# Patient Record
Sex: Male | Born: 1935 | Race: White | Hispanic: No | Smoking: Former smoker
Health system: Southern US, Community
[De-identification: ages and names within clinical notes are randomized; demographics above are authoritative.]

## PROBLEM LIST (undated history)

## (undated) DIAGNOSIS — I499 Cardiac arrhythmia, unspecified: Secondary | ICD-10-CM

## (undated) DIAGNOSIS — I219 Acute myocardial infarction, unspecified: Secondary | ICD-10-CM

## (undated) DIAGNOSIS — N400 Enlarged prostate without lower urinary tract symptoms: Secondary | ICD-10-CM

## (undated) DIAGNOSIS — Z950 Presence of cardiac pacemaker: Secondary | ICD-10-CM

## (undated) DIAGNOSIS — F0390 Unspecified dementia without behavioral disturbance: Secondary | ICD-10-CM

## (undated) DIAGNOSIS — I10 Essential (primary) hypertension: Secondary | ICD-10-CM

## (undated) DIAGNOSIS — M1990 Unspecified osteoarthritis, unspecified site: Secondary | ICD-10-CM

## (undated) DIAGNOSIS — E079 Disorder of thyroid, unspecified: Secondary | ICD-10-CM

## (undated) DIAGNOSIS — R001 Bradycardia, unspecified: Secondary | ICD-10-CM

## (undated) DIAGNOSIS — I509 Heart failure, unspecified: Secondary | ICD-10-CM

## (undated) DIAGNOSIS — R0602 Shortness of breath: Secondary | ICD-10-CM

## (undated) DIAGNOSIS — I209 Angina pectoris, unspecified: Secondary | ICD-10-CM

## (undated) DIAGNOSIS — I2510 Atherosclerotic heart disease of native coronary artery without angina pectoris: Secondary | ICD-10-CM

## (undated) HISTORY — PX: APPENDECTOMY: SHX54

## (undated) HISTORY — DX: Heart failure, unspecified: I50.9

## (undated) HISTORY — PX: CORONARY ANGIOPLASTY: SHX604

## (undated) HISTORY — DX: Disorder of thyroid, unspecified: E07.9

## (undated) HISTORY — PX: INSERT / REPLACE / REMOVE PACEMAKER: SUR710

---

## 2001-05-28 DIAGNOSIS — I219 Acute myocardial infarction, unspecified: Secondary | ICD-10-CM

## 2001-05-28 HISTORY — DX: Acute myocardial infarction, unspecified: I21.9

## 2001-07-12 ENCOUNTER — Inpatient Hospital Stay (HOSPITAL_COMMUNITY): Admission: RE | Admit: 2001-07-12 | Discharge: 2001-07-15 | Payer: Self-pay | Admitting: Cardiology

## 2004-05-28 DIAGNOSIS — I2510 Atherosclerotic heart disease of native coronary artery without angina pectoris: Secondary | ICD-10-CM

## 2004-05-28 DIAGNOSIS — R001 Bradycardia, unspecified: Secondary | ICD-10-CM

## 2004-05-28 HISTORY — DX: Atherosclerotic heart disease of native coronary artery without angina pectoris: I25.10

## 2004-05-28 HISTORY — DX: Bradycardia, unspecified: R00.1

## 2005-04-07 ENCOUNTER — Inpatient Hospital Stay (HOSPITAL_COMMUNITY): Admission: EM | Admit: 2005-04-07 | Discharge: 2005-04-12 | Payer: Self-pay | Admitting: Emergency Medicine

## 2005-04-09 ENCOUNTER — Encounter (INDEPENDENT_AMBULATORY_CARE_PROVIDER_SITE_OTHER): Payer: Self-pay | Admitting: Cardiology

## 2005-06-18 ENCOUNTER — Ambulatory Visit (HOSPITAL_COMMUNITY): Admission: RE | Admit: 2005-06-18 | Discharge: 2005-06-18 | Payer: Self-pay | Admitting: *Deleted

## 2005-06-18 ENCOUNTER — Encounter (INDEPENDENT_AMBULATORY_CARE_PROVIDER_SITE_OTHER): Payer: Self-pay | Admitting: *Deleted

## 2005-10-14 ENCOUNTER — Emergency Department (HOSPITAL_COMMUNITY): Admission: EM | Admit: 2005-10-14 | Discharge: 2005-10-14 | Payer: Self-pay | Admitting: Emergency Medicine

## 2005-10-30 HISTORY — PX: OTHER SURGICAL HISTORY: SHX169

## 2007-02-10 ENCOUNTER — Encounter: Admission: RE | Admit: 2007-02-10 | Discharge: 2007-02-10 | Payer: Self-pay | Admitting: Orthopaedic Surgery

## 2007-03-03 ENCOUNTER — Encounter: Admission: RE | Admit: 2007-03-03 | Discharge: 2007-03-03 | Payer: Self-pay | Admitting: Orthopaedic Surgery

## 2007-03-25 ENCOUNTER — Encounter: Admission: RE | Admit: 2007-03-25 | Discharge: 2007-03-25 | Payer: Self-pay | Admitting: Orthopaedic Surgery

## 2007-03-29 HISTORY — PX: PACEMAKER INSERTION: SHX728

## 2008-10-08 ENCOUNTER — Encounter: Admission: RE | Admit: 2008-10-08 | Discharge: 2008-10-08 | Payer: Self-pay | Admitting: Internal Medicine

## 2010-02-13 ENCOUNTER — Encounter: Admission: RE | Admit: 2010-02-13 | Discharge: 2010-02-13 | Payer: Self-pay | Admitting: Internal Medicine

## 2010-06-18 ENCOUNTER — Encounter: Payer: Self-pay | Admitting: Orthopaedic Surgery

## 2010-10-13 NOTE — Discharge Summary (Signed)
[place]. [facility]  Patient:    Johnathan [name], GIN Visit Number: [id number] MRN: [id number]          Service Type: MED Location: [phone] Attending Physician:  Silvestre Mesi Dictated by:   Aram Candela. Aleen Campi, M.D. Admit Date:  07/11/2001 Discharge Date: 07/15/2001   CC:         Bernadene Person, M.D.   Discharge Summary  DISCHARGE DIAGNOSES: 1. Acute anterior myocardial infarction. 2. Single vessel coronary artery disease. 3. Successful emergency angioplasty of his left anterior descending. 4. Hypokalemia.  REASON FOR ADMISSION:  This 77 year old male was admitted by Dr. Juleen China from his office where he presented with severe anterior chest pain.  An electrocardiogram in Dr. Otilio Carpen office revealed acute ST segment elevation in his anteroseptal leads.  He was then sent by ambulance to the cardiac catheterization lab at [facility] for an emergency cardiac catheterization.  His cardiac exam on admission revealed an irregular rhythm with an increased PMI and normal first and second heart sounds.  He had an S3 gallop at the apex.  He had no other signs of congestive heart failure.  He was noticeably uncomfortable at rest and was complaining of anterior chest pain.  Blood pressure was 122/90, pulse 75 and irregular.  Electrocardiogram showed ST elevation in his anteroseptal leads which was changed from his prior ECG.  HOSPITAL COURSE:  The patient was transferred from Dr. Otilio Carpen office to the cardiac catheterization lab at [facility] per ambulance.  In the catheterization lab, he had an emergent cardiac catheterization where he was found to have single vessel coronary artery disease and a critical 99% stenosis of his mid LAD with antegrade flow.  He underwent a successful angioplasty with stenting of his mid LAD improving his flow in his LAD. His chest pain was relieved and he had an improvement in his rhythm with much less ectopy.   He was also noted to have left ventricular dysfunction with anterior apical hypokinesia.  Postcatheterization, he had mild chest pain which was constant and felt to be a soreness in his chest.  His enzymes were remarkably low for the amount of chest pain and electrocardiographic changes that were present and denoted a small myocardial infarction.  With the amount of hypokinesia in his anterior apical area, it was felt that he either had stunned myocardium or a prior infarct in this area.  He remained stable without signs of congestive heart failure and tolerated increased activity with walking in the hall.  With his small elevation in enzymes and toleration of increased activity, he was considered stable for discharge on his third hospital day, July 15, 2001.  He was then discharged to home in stable, improving condition and given instructions for followup.  DISCHARGE MEDICATIONS: 1. Aspirin 81 mg q.d. 2. Flomax 0.4 mg q.h.s. 3. Plavix 75 mg q.d. x1 month. 4. Metoprolol 12.5 mg b.i.d.  ACTIVITY:  He was instructed to gradually increase his activity level to full activity within three weeks.  DIET:  Low cholesterol, low fat diet.  FOLLOWUP:  Office visit with Dr. Aleen Campi in two weeks after discharge. Follow up with Dr. Juleen China per routine.  CONDITION ON DISCHARGE:  Improving and stable. Dictated by:   Aram Candela. Aleen Campi, M.D. Attending Physician:  Silvestre Mesi DD:  07/24/01 TD:  07/25/01 Job: [id number]

## 2010-10-13 NOTE — H&P (Signed)
[place]. [facility]  Patient:    FAY, SWIDER Visit Number: [id number] MRN: [id number]          Service Type: MED Location: [phone] Attending Physician:  Silvestre Mesi Dictated by:   Tarri Fuller, P.A. Admit Date:  07/11/2001 Discharge Date: 07/15/2001                           History and Physical  DATE OF BIRTH:  [birth year]  CHIEF COMPLAINT:  Chest pain.  HISTORY OF PRESENT ILLNESS:  An 77 year old male presents to the office today with complaints of intermittent chest pain occurring both with rest and exertion during the last week.  He has had no associated shortness of breath, nausea, vomiting, diaphoresis, dizziness.  The pain does not radiate.  The pain is located substernally between the breasts and he describes the pain as a pressure.  He does have some occasional belching, but denies heartburn, dysphagia, nausea, vomiting, fever, abdominal or back pain, chills, edema, orthopnea.  He is a former smoker many years ago.  He frequently has an alcoholic drink before dinner, usually beer or wine.  He plays golf regularly and does both walk and ride the course.  He has not had any recent change in his exercise tolerance or pain in the chest while playing golf.  His father did die at 30 of a heart attack but no other family history of heart disease is noted.  REVIEW OF SYSTEMS:  Patient does complain of a recent cough.  No hemoptysis. He denies melena, hematochezia, urinary urgency, frequency, or dysuria.  No rash.  He does have a recent history of shingles in the right chest wall that has healed well.  He has had some loose stools for the last few days.  PAST MEDICAL HISTORY:  Hypertension, BPH, former smoker.  PAST SURGICAL HISTORY:  Bilateral inguinal herniorrhaphy, history of ruptured appendix, shoulder surgery, sinus surgery.  FAMILY HISTORY:  Mother died at 73 of unknown causes and had a nervous breakdown according  to patient.  Father died at age 62 of a heart attack.  One sister died in her 14s of ALS.  One brother died at 21 months of age from flu. Second brother died at 50 of suicide.  Third brother died at 19 of unknown causes.  His wife had TB at the age of 50.  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  He is a former smoker and does have an alcoholic beverage most days of the week.  ALLERGIES:  SKELAXIN.  CURRENT MEDICATIONS: 1. Norvasc 10 mg q.d. 2. HCTZ 12.5 mg q.d. 3. Flomax 0.4 mg q.h.s. 4. Aspirin q.d. 5. Eldercaps q.d.  OBJECTIVE:  VITAL SIGNS:  Age:  77.  Weight 153.5 pounds, blood pressure 122/90.  GENERAL:  Very pleasant, well-developed, well-nourished Caucasian elderly male in no acute distress.  Nontoxic appearing and well oriented, good historian. He has not received any nitroglycerin or aspirin here in the office, but did take some aspirin and Tylenol this morning.  HEENT:  Patient has good dentition without dentures or partial noted.  Uvula midline.  PERRLA.  Extraocular movements grossly normal.  Hearing is grossly normal.  TMs:  Clear.  NECK:  Supple without lymphadenopathy, JVD, strider, or retraction.  Possible right carotid bruit is noted.  CHEST:  Clear to auscultation bilaterally without wheezing, rales, rhonchi, or crackles.  CARDIAC:  Irregular  rate and rhythm without murmur.  No PMI displacement.  ABDOMEN:  Soft, nontender, and nondistended with ventral midline hernia which is not incarcerated.  He has good bowel sounds without bruits or pulsatile mass.  No organomegaly.  RECTAL:  No stool in the vault.  Normal rectal tone with II+ soft, nontender prostate.  EXTREMITIES:  Warm and dry with evidence of chronic sun exposure.  No edema or rash.  Grip strength is 5/5 and he has grossly normal musculoskeletal strength with a normal gait.  NEUROLOGIC:  Affect is pleasant and appropriate.  LABORATORIES:  EKG shows a new  T-wave inversion in precordial leads V2-V5 which represent an acute change since December 2002.  Frequent PACs are noted. Chest x-ray from December is sent with the patient to the hospital.  ASSESSMENT: 1. Acute chest pain with abnormal EKG, rule out myocardial infarction. 2. Hypertension.  PLAN:  Admit directly to [facility] Coronary Catheter[facility] with transport via EMS.  Patient is in stable condition.  Dr. Aleen Campi will perform catheterization immediately today.  Patient is discharged from office in stable condition with orders as completed by Dr. Juleen China. Dictated by:   Tarri Fuller, P.A. Attending Physician:  Silvestre Mesi DD:  07/11/01 TD:  07/11/01 Job: [id number]

## 2010-10-13 NOTE — Consult Note (Signed)
NAMEGARI, HARTSELL                ACCOUNT NO.:  [id number]   MEDICAL RECORD NO.:  [id number]          PATIENT TYPE:  INP   LOCATION:  6533                         FACILITY:  [facility]   PHYSICIAN:  Doylene Canning. Ladona Ridgel, M.D.  DATE OF BIRTH:  [birth year]   DATE OF CONSULTATION:  04/10/2005  DATE OF DISCHARGE:                                   CONSULTATION   [name] is referred for consultation for nonsustained VT and complex  ventricular ectopy in the setting of coronary artery disease.   HISTORY OF PRESENT ILLNESS:  The patient is a very pleasant 77 year old man  who remains active exercising twice a day. He notes that over the last  several days he had had increasing fatigue, lightheadedness, and weakness.  He subsequently presented to the hospital where he ruled out for MI. He was  also noted to have nonsustained VT and very frequent and complex ventricular  ectopy typically in bigeminy and trigeminal pattern. The patient  subsequently underwent catheterization by Dr. Aleen Campi which demonstrated a  75% to 85% stenosis in the proximal LAD just proximal to a prior stent  placement. He underwent successful percutaneous intervention of this vessel.  His LV function was in the 45% to 50% range by LV gram, albeit a post PVC  beat. He is now referred for additional evaluation. The patient denies any  history of frank syncope. He denies congestive heart failure symptoms. He  denies much in the way of palpitations.   PAST MEDICAL HISTORY:  Notable for hypertension and BPH. He had an MI in  2003 with emergency angioplasty and stenting of the LAD and diagonal branch.   FAMILY HISTORY:  Notable for coronary artery disease. His father died of an  MI at age 66.   SOCIAL HISTORY:  The patient is widowed and lives with his friend. He has  one son. He quit smoking cigarettes over 30 years ago. He plays golf twice a  week. He rarely drinks alcohol. He has no known drug allergies.   MEDICATIONS ON  ADMISSION:  1.  Toprol XL 25 mg daily.  2.  Flomax.  3.  Aspirin.  4.  Multivitamin.   REVIEW OF SYSTEMS:  Notable for dizziness, weakness, and lightheadedness as  previously noted, which has been present for the less than two weeks. He had  one episode of nausea and vomiting. He denies chest pain or shortness of  breath. He denies polyuria, polydipsia, heat or cold intolerance, recent  weight changes, nausea, vomiting, diarrhea, or constipation. Cough or  hemoptysis were not present.  The rest of his review of systems is negative.   PHYSICAL EXAMINATION:  GENERAL: He is a pleasant, elderly appearing man in  no acute distress.  VITAL SIGNS: Blood pressure 150/95, pulse 38 and irregular, respirations 18,  temperature 98.  HEENT: Normocephalic and atraumatic. Pupils equal and round. Oropharynx is  moist. Sclerae anicteric.  NECK: No jugular venous distention. There is no thyromegaly. Trachea  midline. Carotids are 2+ and symmetric.  LUNGS: Clear bilaterally to auscultation. No rales, rhonchi, or wheezes.  CARDIOVASCULAR: Regular rate  and rhythm with normal S1 and S2. No murmurs,  rubs, or gallops present. His heart rhythm is irregular. There is a grade  2/6 systolic murmur at the left lower sternal border and the right upper  sternal border as well. PMI was not laterally displaced.  ABDOMEN: Soft, nontender, and nondistended. There is no organomegaly.  EXTREMITIES: There is no clubbing, cyanosis, or edema.  SKIN: There are skin changes.  NEUROLOGIC: Alert and oriented times three with cranial nerves are intact.  Strength is 5/5 and symmetric.   EKG demonstrates sinus rhythm with frequent PVCs in a bigeminal pattern.   IMPRESSION:  1.  Nonsustained ventricular tachycardia and complex ventricular ectopy.  2.  Coronary artery disease, status post stenting with recent percutaneous      intervention of the left anterior descending proximal to the stent site.      This is all in the  setting of preserved left ventricular function.  3.  Sinus bradycardia. In light of the patient's advanced age and relatively      preserved left ventricular function, I do not think that      prophylactically ICD implantation nor invasive electrophysiology study      would be indicated. Rather permanent pacemaker for bradycardia and up      titration of beta blocker therapy would be my first recommendation. His      pacemaker should have ample electrical storage to document whether he      has any additional symptomatic VT. Ultimately if there were the case      and/or his ventricular ectopy persisted, then low dose amiodarone would      also be an option in conjunction with backup pacing.           ______________________________  Doylene Canning. Ladona Ridgel, M.D.     GWT/MEDQ  D:  04/10/2005  T:  04/11/2005  Job:  [id number]   cc:   Brooke Bonito, M.D.  Fax: [phone]

## 2010-10-13 NOTE — Cardiovascular Report (Signed)
NAMEGEOGE, LAWRANCE                ACCOUNT NO.:  [id number]   MEDICAL RECORD NO.:  [id number]          PATIENT TYPE:  INP   LOCATION:  6599                         FACILITY:  [facility]   PHYSICIAN:  Cristy Hilts. Jacinto Halim, MD       DATE OF BIRTH:  07-Jun-1915   DATE OF PROCEDURE:  04/10/2005  DATE OF DISCHARGE:                              CARDIAC CATHETERIZATION   PROCEDURE PERFORMED:  Percutaneous transluminal coronary angioplasty and  stenting of the proximal left anterior descending.   INDICATION:  Mr. Alucard Fearnow is a fairly active 77 year old gentleman, who  was admitted with lethargy, generalized tiredness.  He had an episode of  nonsustained ventricular tachycardia.  Given his prior history of coronary  artery disease and anterior wall myocardial infarction for which he had  undergone PTCA and stenting of his mid LAD on July 11, 2001 with a 2.75  x 13 mm multi-link stent underwent diagnostic cardiac catheterization by Dr.  Aleen Campi earlier this morning and was found to have a high-grade 90%  stenosis of the mid LAD.  There was also mild 20-30% end-stent restenosis in  the previous mid LAD stent.  There was also haziness noted in the proximal  and mid segment of the LAD.   Given the rhythm problems and also high-grade stenosis of the LAD, he was  referred to me for consideration of angioplasty.  After having explained the  risks, benefits, and alternatives, the patient was brought to the cardiac  cath lab for angioplasty of his LAD.   ANGIOGRAPHIC DATA:  Please review the angiographic data from Dr. Charolette Child.   Briefly, left main coronary artery showed mild calcification with large  caliber vessel normal.   Circumflex is a moderate caliber vessel.  Continues in the AV groove, has  mild luminal irregularity.   Ramus intermedius is a moderate caliber vessel with mild luminal  irregularity.   LAD:  The LAD is a large caliber vessel.  Gives origin to two small  diagonals from  the proximal and mid segments and a very large diagonal from  the mid segment.  The diagonal three has an ostial 50-60% stenosis.  There  is a LAD stent noted in the mid segment just extending from the mid segment  up to the origin of this diagonal three which is a very large vessel.  This  shows a 20-30% end-stent restenosis.  However, at the in-flow of the stent,  there is a high-grade 80-90% stenosis followed by haziness in the proximal  LAD.   INTERVENTIONAL DATA:  Successful PTCA and direct stenting of the proximal  and mid segment of the LAD with a 2.5 x 13 mm CYPHER overlapping with the  previously placed multi-link stent.  The stent was deployed at 10  atmospheres of pressure for 60 seconds.  This stent was post dilated with  2.75 x 8 mm PowerSail balloon at 16 atmospheres of pressure and 14  atmospheres of pressure for 50 seconds each.  Overall the stenosis was  reduced from 90% to 0% with TIMI III to TIMI III maintained at  the end of  the procedure.   RECOMMENDATIONS:  The patient will continue on aspirin and Plavix for at  least a period of six months to a year and further management is as per Dr.  Charolette Child.  Patient will also be scheduled for permanent transvenous  pacemaker implantation by Dr. Charolette Child given his significant  symptomatic bradycardia.   A total of 100 mL of contrast was utilized for interventional procedure.  Anticoagulation was with the help of Angiomax.   TECHNIQUES OF THE PROCEDURE:  Under the usual sterile precautions using a #7  French left femoral arterial access, a #7 [place] FL4 diode was advanced into  the ascending artery over a 0.035 inch J-wire.  Then using Angiomax for  anticoagulation, 190 cm x 0.014 inch ATW guidewire was advanced into the LAD  and the lesion length was carefully measured.  After careful consideration  of the lesion length and also evaluating the end-stent restenosis, I decided  that it was prudent to just proceed  with stenting of the proximal LAD  leaving the end-stent restenosis of the mid LAD untouched.  Again after  taking a 2.5 x 13 mm CYPHER stent at the lesion site, the stent was deployed  at 10 atmospheres of pressure for 60 seconds and then was post dilated with  a 2.75 x 8 mm PowerSail at 16 atmospheres of pressure for 50 seconds x2.  The intracoronary nitroglycerin was administered, angiography was performed.  Excellent results were noted with preservation of small diagonal branches.  Overall the patient tolerated the procedure.  No immediate complications  were noted.      Cristy Hilts. Jacinto Halim, MD  Electronically Signed     JRG/MEDQ  D:  04/10/2005  T:  04/11/2005  Job:  [id number]   cc:   Antionette Char, MD  Fax: [phone]

## 2010-10-13 NOTE — Cardiovascular Report (Signed)
[place]. [facility]  Patient:    Johnathan [name], Johnathan [name] Visit Number: [id number] MRN: [id number]          Service Type: CAT Location: [facility] 2929 01 Attending Physician:  Silvestre Mesi Dictated by:   Aram Candela. Aleen Campi, M.D. Proc. Date: 07/11/01 Admit Date:  07/11/2001   CC:         Bernadene Person, M.D.  Cardiac Catheterization Lab   Cardiac Catheterization  REFERRING PHYSICIAN:  Bernadene Person, M.D.  PROCEDURE DONE BY:  Aram Candela. Aleen Campi, M.D.  PROCEDURES: 1. Emergency left heart catheterization. 2. Coronary cineangiography. 3. Left internal mammary artery cineangiography. 4. Left ventricular cineangiography. 5. Abdominal aortogram. 6. Angioplasty with percutaneous transluminal coronary angioplasty and stent    placement in the mid left anterior descending artery lesion. 7. Percutaneous transluminal coronary angioplasty of the second diagonal    branch.  INDICATION FOR PROCEDURES:  This 77 year old male presented to Dr. Otilio Carpen office today with severe anterior chest pain and electrocardiogram showed acute ST segment elevation in his anteroseptal leads.  He was then transferred to the cardiac catheterization lab at [facility] for an emergency cardiac catheterization.  He has a long history of hypertension.  DESCRIPTION OF PROCEDURE:  After signing an informed consent, the patient was brought to the catheterization lab at [facility] where his right groin was prepped and draped in a sterile fashion and anesthetized locally with 1% lidocaine.  A #7 French introducer sheath was inserted percutaneously into the right femoral artery.  The #7 [place] #4 Judkins coronary catheters were used to make injections into the coronary arteries.  The #7 [place] right coronary catheter was used to make injections into the left subclavian artery visualizing the left internal mammary artery.  A #7 French pigtail catheter was used to measure pressures  in the left ventricle and aorta and to make midstream injections into the left ventricle and abdominal aorta.  After noting single vessel coronary artery disease with a critical stenosis in his mid LAD of 95-99% and TIMI-2 antegrade flow, we discussed this finding with the patient and with his knowledge and permission, we proceeded with an emergency angioplasty procedure.  We selected a #7 [place] guide catheter which was advanced to the root of the aorta.  After engaging the tip of this #7 French catheter in the ostium of the left main coronary artery, a short Hi-Torque Floppy guidewire was advanced through the guide catheter and into the LAD.  After mild difficulty, it was advanced through the mid LAD lesion and into the distal LAD.  We then selected a 2.5- x 15-mm angioplasty balloon catheter and after proper preparation this Maverick balloon catheter was advanced over the guidewire in position within the mid LAD lesion.  One inflation was made at four atmospheres for 29 seconds.  After this inflation the balloon catheter was removed and a good partial opening was seen in the mid LAD lesion and with mild dissection distally in the LAD.  After sizing the vessel and length of lesion, we selected a 2.75 Zeta stent deployment system and after proper preparation this was inserted over the guidewire and advanced into the mid LAD lesion.  The stent was then deployed with one inflation at 10 atmospheres for 44 seconds.  Following the stent deployment, the deployment balloon was removed and injection again into the LAD showed an excellent angiographic within the mid LAD lesion and marked improvement in the distal dissection.  Also noted was  the takeoff of the second anterolateral branch which was just distal to the stent.  The takeoff of this second anterolateral branch showed a marked 95% focal stenosis.  Nitroglycerin, 200 units, was given intracoronary and this stenotic lesion failed to  open.  We then inserted a second short Hi-Torque Floppy guidewire through the guide catheter into the LAD and advanced through the stent and into the second anterolateral branch. We then reinserted the 2.5 Maverick balloon catheter over this second guidewire and advanced the balloon catheter into the ostium of the second anterolateral branch.  Three inflations were made, the first at two atmospheres for 60 seconds, the second at two atmospheres for three minutes, and the third at four atmospheres for two minutes.  After this final inflation injections were again made into the left coronary catheter showing a good angiographic result and the second anterolateral lesion and also preservation of the stent angioplasty result in the mid LAD.  The distal LAD distal to the stent in the dissected area now appeared essentially normal.  There was normal antegrade flow distally in the LAD and also in the second anterolateral branch.  The patient tolerated the procedure well and no complications were noted.  At the end of the procedure the catheter was removed from the right femoral artery sheath and this #7 [place] sheath was exchanged for an #8 [place] sheath because of continued oozing around the sheath during the procedure. Constant pressure was applied by myself and the catheterization lab technician throughout the procedure so that no significant hematoma formed during the procedure.  This #7 [place] sheath was exchanged for an #8 [place] sheath which successfully maintained hemostasis from that point.  This #8 [place] sheath was then sutured in place using a 1-0 silk.  The patient tolerated the procedure well and no complications were noted at the end of the procedure.  He was then admitted to the CCU for further monitoring an continuation of Integrilin drip.  MEDICATIONS GIVEN:  Heparin 3000 units IV, Integrilin drip per pharmacy protocol, nitroglycerin intracoronary 200 units x 2.   HEMODYNAMIC  DATA:  Left ventricular pressure 147/10-26.  Aortic pressure 147/55 with a mean of 94.  Left ventricular ejection fraction was estimated at 30%.  CINE FINDINGS:  CORONARY CINEANGIOGRAPHY: 1. Left coronary artery:  The ostium and left main appear normal. 2. Left anterior descending:  There is a plaque in the proximal segment which    tapers toward the first anterolateral and septal branches causing a 40%    stenosis at the level of the takeoff of these two branches.  Distal to the    septal and anterolateral branch this plaque continues to taper and causes a    critical 95-99% stenosis in the middle segment between the first and second    anterolateral branches.  The distal LAD has diffuse plaque throughout with    mild to moderate narrowing but without a significant focal area of    stenosis.  The second anterolateral branch had an ostial lesion of    approximately 50%.  There was slow antegrade flow distal to the mid LAD    lesion which was estimated at TIMI-2 level. 3. Circumflex coronary artery:  The circumflex is a moderate sized vessel    which has a large AV groove distribution and supplies several small distal    posterolateral branches.  There is a large intermedius branch which    supplies the obtuse marginal circulation and there is a segmental plaque in  its proximal segment causing a 30-40% segmental narrowing.  There is normal    antegrade flow in the intermedius branch and also in the AV groove    circumflex. 4. Right coronary artery:  The right coronary artery is a large dominant    vessel supplying the posterior descending and most of the posterolateral    circulation to the left ventricle.  The entire right coronary artery is    essentially normal with only very minor plaque without significant    stenosis. 5. Left internal mammary artery:  Appears normal.  LEFT VENTRICULAR CINEANGIOGRAM:  The left ventricular chamber size is mildly enlarged.  The overall left  ventricular contractility is moderate to severely decreased.  There is severe anterior and apical hypokinesia with small areas of akinesia in the apex and anterior segment.  The inferior segment has normal contractility.  The function at the anterior basilar area is preserved and has normal contractility.  ABDOMINAL AORTOGRAM:  The abdominal aorta shows diffuse calcified atherosclerotic plaque throughout its mid and distal segments.  The renal arteries appear normal with normal flow.  The plaque in the distal aorta is moderately irregular.  The common iliac arteries appear normal with moderate tortuosity.  ANGIOPLASTY CINES:  Cines taken during the angioplasty procedure shows proper positioning of the guidewire and balloon catheter with a good balloon form obtained during the angioplasty of the mid LAD lesion.  Follow-up injection showed an irregular opening of the mid LAD lesion and the appearance of dissection distal to the lesion of approximately 15 mm.  The takeoff of the second anterolateral branch appeared compromised in this view now with a 60-70% stenosis.  There was marked improvement in antegrade flow after the first balloon dilation.  STENT DEPLOYMENT CINES:  Cines taken during the stent deployment showed proper positioning of the stent system and injection after the deployment balloon was then moved showed an excellent angiographic result within the stent and marked compromise in the ostium of the second anterolateral branch.  The dissected area distal to the stent in the LAD was markedly improved, now showing only trace evidence of the dissection.  There was mildly slow flow in the second anterolateral branch.  Further cines showed post intracoronary nitroglycerin study showing continued compromise of the ostium of the second anterolateral branch.  Further cines showed proper positioning of the second Hi-Torque Floppy guidewire across the lesion in the second  anterolateral branch with continuance in place of the first guidewire in the distal LAD.  Further cines showed proper positioning of the balloon catheter across this first anterolateral branch lesion in the ostium and a good balloon form obtained.  Final injections into the LAD showed an excellent angiographic result in the ostium of the second anterolateral branch with reestablishment of normal antegrade flow and no evidence for residual stenosis.  The segment in the LAD distal to the stent now appears as it did in the baseline injections and evidence for the dissection was no longer seen.  FINAL DIAGNOSES: 1. Single-vessel coronary artery disease with 99% stenosis of the mid left    anterior descending artery. 2. Compromised ostium of the second anterolateral branch after stent    placement in the mid left anterior descending artery lesion. 3. Successful angioplasty with percutaneous transluminal coronary angioplasty    and stenting of the mid left anterior descending artery lesion. 4. Successful percutaneous transluminal coronary angioplasty of the second    anterolateral branch ostial lesion. 5. Normal left internal mammary artery. 6. Moderate  left ventricular dysfunction with severe anteroapical hypokinesia.  DISPOSITION:  The patient was admitted to the CCU for further monitoring of his acute anteroseptal myocardial infarction and continuation of the Integrilin drip.  His chest pain was completely relieved and his ST segment elevation in his anterior leads had resolved. Dictated by:   Aram Candela. Aleen Campi, M.D. Attending Physician:  Silvestre Mesi DD:  07/11/01 TD:  07/12/01 Job: [id number]

## 2010-10-13 NOTE — Cardiovascular Report (Signed)
Johnathan [name], Johnathan [name]                ACCOUNT NO.:  [id number]   MEDICAL RECORD NO.:  [id number]          PATIENT TYPE:  INP   LOCATION:  6533                         FACILITY:  [facility]   PHYSICIAN:  Antionette Char, MD    DATE OF BIRTH:  05-07-1916   DATE OF PROCEDURE:  04/11/2005  DATE OF DISCHARGE:                              CARDIAC CATHETERIZATION   PROCEDURE:  Insertion of dual chamber permanent transvenous pacemaker.   INDICATIONS FOR PROCEDURE:  An 77 year old male admitted with weakness and  dizziness and monitor revealed a marked sinus bradycardia with frequent  multifocal PVCs and one episode of nonsustained ventricular tachycardia.  He  had cardiac catheterization on April 10, 2005 which revealed a critical  stenosis in his proximal LAD proximal to his prior stent from 2003.  This  lesion was then stented by Dr. Jacinto Halim and he is now stable post  catheterization and angioplasty.  He had an electrophysiology consultation  by Dr. Ladona Ridgel who recommended permanent pacing, primarily atrial pacing at a  faster rate to inhibit much of his ventricular ectopy and also increased  beta blocker.  Therefore, anticipation of the increased beta blocker and  further decrease in heart rate, the AV sequential pacer would be necessary.  He was then scheduled for placement of the pacemaker today.   PROCEDURE:  After signing an informed consent the patient was pre medicated  with 5 mg of Valium by mouth and brought to the cardiac catheterization  laboratory.  In the catheterization laboratory his right anterior chest and  base of neck were prepped and draped in sterile fashion and a right  transverse subclavicular plane was anesthetized locally with 1% lidocaine.  An incision was made in this anesthetized plane with the incision being  deepened into the fascial layer overlying the pectoralis muscle.  A pocket  was then formed in this fascial layer for later insertion of the pulse  generator.  A  #8 Cook introducer sheath was inserted percutaneously into the  right subclavian vein with a Seldinger wire being easily passed into the  superior vena cava.  A [employer] bipolar lead was then selected,  model #1336T, serial [id number].  After proper preparation was inserted  through the 8-French sheath and advanced to the superior vena cava.  A #7-  [place] introducer sheath was then inserted percutaneously into the right  subclavian vein with a Seldinger wire being easily passed into the superior  vena cava.  We then selected a [employer] atrial [place], model [id number],  serial [id number].  After proper preparation the atrial J-lead was inserted  through the 7-French sheath and advanced to the superior vena cava.  Both  sheaths were removed in the usual fashion.  The ventricular lead was then  advanced into the right atrium and right ventricle where the tip was  positioned in the apex of the right ventricle.  Very good pacing parameters  were obtained with a minimal voltage threshold of 0.5 volts utilizing 0.7 mA  of current.  The resistance was measured at 670 ohms and the R-wave  sensitivity  measured 23 mitral valve.  After obtaining these pacing  parameters in the ventricle the atrial J-lead was advanced into the right  atrium where the tip was positioned anteriorly in the right atrial  appendage.  Again, very good pacing parameters were obtained with a minimal  voltage threshold of 0.6 volts utilizing 0.7 mA of current.  The resistance  across the lead measured 889 ohms and the P-wave sensitivity measured 2.6  mitral valve.  After obtaining these pacing parameters both leads were  secured at their insertion site.  The wound was lavaged profusely with a  kanamycin solution.  We then selected a [employer] pulse generator  model [place], model [id number], serial [id number].  After properly analyzing  the pulse generator it was attached to the pacing electrodes in the usual   fashion.  Pulse generator was then placed within the previously formed  pocket and the wound was closed in layers using 2-0 Vicryl.  Final skin  closure was obtained with a cutaneous layer of Steri-Strips.  The patient  tolerated procedure well.  No complications were noted.  At the end of the procedure a sterile bulky  dressing was applied to the wound and he was returned to his room in  satisfactory condition.  The pacemaker is noted to be functioning properly  in the DDD mode  Wound care instructions were given and we will check his  wound in the a.m. prior to discharge      Antionette Char, MD  Electronically Signed     JRT/MEDQ  D:  04/11/2005  T:  04/11/2005  Job:  [id number]   cc:   Cath Lab

## 2010-10-13 NOTE — Op Note (Signed)
NAMECLYDELL, SPOSITO                ACCOUNT NO.:  [id number]   MEDICAL RECORD NO.:  [id number]          PATIENT TYPE:  AMB   LOCATION:  ENDO                         [facility]ACILITY:  [facility]   PHYSICIAN:  Georgiana Spinner, M.D.    DATE OF BIRTH:  12/14/1915   DATE OF PROCEDURE:  06/18/2005  DATE OF DISCHARGE:                                 OPERATIVE REPORT   PROCEDURE:  Colonoscopy.   INDICATIONS:  Diarrhea, colon cancer screening.   ANESTHESIA:  Demerol 20, Versed 3 mg.   DESCRIPTION OF PROCEDURE:  With the patient mildly sedated in the left  lateral decubitus position, a rectal examination was performed which was  unremarkable. Subsequently the Olympus videoscopic colonoscope was inserted  in the rectum and passed under direct vision to the cecum identified by the  ileocecal valve and appendiceal orifice both of which were photographed.  From this point, the colonoscope was slowly withdrawn taking circumferential  views of the colonic mucosa stopping only in the rectum which appeared  normal on direct and retroflexed view and also stopping in random spots to  take biopsies of normal-appearing mucosa. The patient's vital signs and  pulse oximeter remained stable. The patient tolerated the procedure well  without apparent complications.   FINDINGS:  Rather unremarkable examination with random biopsies taken. Await  biopsy reports. The patient will call me for results and follow-up with me  as an outpatient.           ______________________________  Georgiana Spinner, M.D.     GMO/MEDQ  D:  06/18/2005  T:  06/18/2005  Job:  [id number]

## 2010-10-13 NOTE — Cardiovascular Report (Signed)
NAMEGASPER, Johnathan [name]                ACCOUNT NO.:  [id number]   MEDICAL RECORD NO.:  [id number]          PATIENT TYPE:  INP   LOCATION:  2038                         FACILITY:  [facility]   PHYSICIAN:  Antionette Char, MD    DATE OF BIRTH:  June 30, 1915   DATE OF PROCEDURE:  04/10/2005  DATE OF DISCHARGE:                              CARDIAC CATHETERIZATION   SURGEON:  Antionette Char, M.D.   PROCEDURES:  1.  Left heart catheterization.  2.  Coronary cineangiography.  3.  Left internal mammary artery cineangiography.  4.  Left ventricular cineangiography.  5.  Abdominal aortogram with bilateral runoff.  6.  AngioSeal of the right femoral artery.   INDICATION FOR PROCEDURES:  This 77 year old male has a history of single-  vessel coronary artery disease and is status post angioplasty with stent  placement in his proximal LAD in February of 2003. He has done well until  this present illness, when he presented with dizziness and weakness to the  emergency room at 3:30 a.m. on April 07, 2005.  He was having a sinus  bradycardia with multiple PVCs, and because of his known coronary artery  disease, he was admitted for evaluation.  His cardiac enzymes were negative,  however, he had a five beat run of ventricular tachycardia and with the  prior history of angioplasty of his LAD, he was scheduled for cardiac  catheterization.   PROCEDURE:  After signing an informed consent, the patient was premedicated  with 5 mg of Valium by mouth and brought to the cardiac catheterization lab.  His right groin was prepped and draped in a sterile fashion and anesthetized  locally with 1% lidocaine.  The 6-French introducer sheath was inserted  percutaneously into the right femoral artery.  A 6-French #4  Judkins  coronary catheters were used to make injections into the native coronary  arteries. The right coronary catheter was used to make a midstream injection  into the left subclavian artery visualizing  left internal mammary artery.  A  6-French pigtail catheter was used to measure pressures in the left  ventricle and aorta and to make midstream injections into the left ventricle  and abdominal aorta. The patient tolerated the procedure well. No  complications were noted.  At the end of the procedure, the catheter and  sheath were removed from the right femoral artery and hemostasis was easily  obtained with an AngioSeal closure system.   MEDICATIONS GIVEN:  None.   CINE FINDINGS:  Coronary cineangiography:  Left coronary artery: The ostium of the left main appear normal.   Left anterior descending:  The LAD has a 75% stenosis in its proximal  segment just proximal to the stent placed which follows the first septal and  first anterolateral branch.  There is moderate restenosis within the stent  causing a 40-50% restenosis. There is antegrade flow with flow into the  distal segment. The mid and distal segments of the LAD appeared normal.   Circumflex coronary artery appears normal.   Right coronary artery appears normal.   Left internal mammary artery appears normal.  Left ventricular cine angiogram:  The left ventricular chamber size appears  normal and the overall left ventricular contractility is mildly decreased  with moderate apical akinesia. The inferior segment to the inferoapical area  is very normal. The anterobasilar and anterior segments also have normal  contractility. The mitral and aortic valves appear normal.   Abdominal aortogram:  The abdominal aorta is diffusely atherosclerotic with  diffusely irregular plaque. but no stenosis or aneurysm.  The renal arteries  appear normal. The iliac arteries also appear normal. There is diffuse  plaque, nonobstructive, in both femoral arteries.   </FINAL DIAGNOSES  1.  Restenosis within the proximal left anterior descending stent, 40-50%      with a focal 75% just proximal to the stent.  2.  Normal circumflex and right  coronary arteries.  3.  Normal left internal mammary.  4.  Fair left ventricular function, ejection fraction 45-50% with moderate      apical akinesia.  5.  Diffuse irregular plaque in the abdominal aorta and femoral arteries.  6.  Normal renal arteries.  7.  Successful Angio-Seal of the right femoral artery.   DISPOSITION:  Will consider possible angioplasty of the proximal LAD and  will need to discuss this with the patient and review with another  cardiologist prior to disposition.  His heart rate remains very slow,  however, he has had no further dizzy spells and no further ventricular  tachycardia.      Antionette Char, MD  Electronically Signed     JRT/MEDQ  D:  04/10/2005  T:  04/10/2005  Job:  [id number]   cc:   [facility]

## 2010-10-13 NOTE — Discharge Summary (Signed)
Johnathan [name], Johnathan [name]                ACCOUNT NO.:  [id number]   MEDICAL RECORD NO.:  [id number]          PATIENT TYPE:  INP   LOCATION:  6533                         FACILITY:  [facility]   PHYSICIAN:  Antionette Char, MD    DATE OF BIRTH:  06-Apr-1916   DATE OF ADMISSION:  04/07/2005  DATE OF DISCHARGE:  04/12/2005                                 DISCHARGE SUMMARY   FINAL DIAGNOSES:  1.  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  2.  Coronary artery disease.  3.  Hypertension.   OPERATIONS DURING ADMISSION:  1.  Cardiac catheterization on April 10, 2005.  2.  Angioplasty with stent placement in the proximal LAD, April 10, 2005.  3.  Permanent transvenous pacemaker, dual chamber, AV sequential, April 11, 2005.   INDICATIONS FOR ADMISSION:  This is an 77 year old male with a history of  single-vessel coronary artery disease, status post angioplasty with stent  placement in his proximal LAD in February 2003, who presented with dizziness  and weakness after having a recent decrease in exertion tolerance for his  routine activity. In the emergency room he had an abnormal EKG with a marked  sinus bradycardia and very frequent PVCs, and was admitted for evaluation.   HOSPITAL COURSE:  During his admission his cardiac enzymes were negative,  however during the first 24 hours of admission he had a short run of  ventricular tachycardia and continued to have multifocal PVCs. He had no  symptomatology while at bedrest on the first day. On the second hospital day  he had weakness when he tried to get up and a cardiac catheterization was  planned for the 14th. At cardiac cath he had a new critical stenosis in his  proximal LAD which was just proximal to his prior stent from 2003. He was  also seen by an electrophysiologist at that time because of his  bradyarrhythmia and ventricular tachycardia, and with a finding of a fairly  good left  ventricular function the electrophysiologist felt that he should  be paced at a faster rhythm with primary atrial pacing and allow normal AV  nodal conduction to save the ventricle. He also recommended a higher dose of  beta blocker and if the patient continued to have episodes of ventricular  tachycardia and weakness then we should start amiodarone at that time. With  this advice he was then scheduled for permanent pacing and the pacemaker was  inserted on April 11, 2005, without difficulty finding good conduction in  his AV node. Post angioplasty and pacemaker insertion he has done well and  is now stable for discharge. On exam today his pacemaker wound has a very  good appearance and his pacemaker is functioning normally with primary  atrial pacing. He has much less ventricular ectopy with pacing and increased  beta blocker. He is now stable and improved, and ready for discharge. Wound  care instructions were given.   DISPOSITION ON DISCHARGE:   MEDICATIONS:  1.  Plavix 75 mg daily.  2.  Toprol-XL  100  mg daily.  3.  Aspirin 81 mg daily.  4.  Flomax 0.4 mg q.h.s.  5.  Multivitamins daily.   ACTIVITY LEVEL:  No heavy straining for three days, then normal activity.   DIET:  Heart healthy diet.   WOUND CARE:  Keep wound dry for one week.   FOLLOWUP APPOINTMENT:  With Dr. Aleen Campi in the office in one week, to call  for an appointment.   CONDITION ON DISCHARGE:  Stable and improved.      Antionette Char, MD  Electronically Signed     JRT/MEDQ  D:  04/12/2005  T:  04/12/2005  Job:  [id number]

## 2011-05-28 ENCOUNTER — Emergency Department (HOSPITAL_COMMUNITY)
Admission: EM | Admit: 2011-05-28 | Discharge: 2011-05-29 | Disposition: A | Discharge status: Home / Self Care | Payer: Medicare Other | Attending: Emergency Medicine | Admitting: Emergency Medicine

## 2011-05-28 ENCOUNTER — Encounter: Payer: Self-pay | Admitting: *Deleted

## 2011-05-28 ENCOUNTER — Other Ambulatory Visit: Payer: Self-pay

## 2011-05-28 ENCOUNTER — Emergency Department (HOSPITAL_COMMUNITY): Payer: Medicare Other

## 2011-05-28 DIAGNOSIS — I252 Old myocardial infarction: Secondary | ICD-10-CM | POA: Insufficient documentation

## 2011-05-28 DIAGNOSIS — R5383 Other fatigue: Secondary | ICD-10-CM | POA: Insufficient documentation

## 2011-05-28 DIAGNOSIS — Z79899 Other long term (current) drug therapy: Secondary | ICD-10-CM | POA: Insufficient documentation

## 2011-05-28 DIAGNOSIS — Z950 Presence of cardiac pacemaker: Secondary | ICD-10-CM | POA: Insufficient documentation

## 2011-05-28 DIAGNOSIS — F068 Other specified mental disorders due to known physiological condition: Secondary | ICD-10-CM | POA: Insufficient documentation

## 2011-05-28 DIAGNOSIS — Z7982 Long term (current) use of aspirin: Secondary | ICD-10-CM | POA: Insufficient documentation

## 2011-05-28 DIAGNOSIS — M7989 Other specified soft tissue disorders: Secondary | ICD-10-CM | POA: Insufficient documentation

## 2011-05-28 DIAGNOSIS — R5381 Other malaise: Secondary | ICD-10-CM | POA: Insufficient documentation

## 2011-05-28 DIAGNOSIS — R609 Edema, unspecified: Secondary | ICD-10-CM | POA: Insufficient documentation

## 2011-05-28 DIAGNOSIS — R197 Diarrhea, unspecified: Secondary | ICD-10-CM | POA: Insufficient documentation

## 2011-05-28 DIAGNOSIS — I2510 Atherosclerotic heart disease of native coronary artery without angina pectoris: Secondary | ICD-10-CM | POA: Insufficient documentation

## 2011-05-28 HISTORY — DX: Acute myocardial infarction, unspecified: I21.9

## 2011-05-28 HISTORY — DX: Atherosclerotic heart disease of native coronary artery without angina pectoris: I25.10

## 2011-05-28 LAB — DIFFERENTIAL
Basophils Absolute: 0 10*3/uL (ref 0.0–0.1)
Eosinophils Absolute: 0.2 10*3/uL (ref 0.0–0.7)
Eosinophils Relative: 3 % (ref 0–5)
Lymphocytes Relative: 17 % (ref 12–46)
Lymphs Abs: 0.9 10*3/uL (ref 0.7–4.0)
Monocytes Absolute: 0.8 10*3/uL (ref 0.1–1.0)
Monocytes Relative: 16 % — ABNORMAL HIGH (ref 3–12)
Neutro Abs: 3.3 10*3/uL (ref 1.7–7.7)

## 2011-05-28 LAB — CBC
HCT: 35.9 % — ABNORMAL LOW (ref 39.0–52.0)
Hemoglobin: 12.1 g/dL — ABNORMAL LOW (ref 13.0–17.0)
MCH: 34.2 pg — ABNORMAL HIGH (ref 26.0–34.0)
MCHC: 33.7 g/dL (ref 30.0–36.0)
MCV: 101.4 fL — ABNORMAL HIGH (ref 78.0–100.0)
Platelets: 173 10*3/uL (ref 150–400)
RBC: 3.54 MIL/uL — ABNORMAL LOW (ref 4.22–5.81)
RDW: 13.6 % (ref 11.5–15.5)
WBC: 5.1 10*3/uL (ref 4.0–10.5)

## 2011-05-28 LAB — URINALYSIS, ROUTINE W REFLEX MICROSCOPIC
Bilirubin Urine: NEGATIVE
Glucose, UA: NEGATIVE mg/dL
Leukocytes, UA: NEGATIVE
Nitrite: NEGATIVE
Specific Gravity, Urine: 1.025 (ref 1.005–1.030)
Urobilinogen, UA: 0.2 mg/dL (ref 0.0–1.0)
pH: 6 (ref 5.0–8.0)

## 2011-05-28 LAB — BASIC METABOLIC PANEL
CO2: 28 mEq/L (ref 19–32)
Calcium: 8.8 mg/dL (ref 8.4–10.5)
Chloride: 102 mEq/L (ref 96–112)
Creatinine, Ser: 0.82 mg/dL (ref 0.50–1.35)
GFR calc non Af Amer: 74 mL/min — ABNORMAL LOW (ref >90)
Glucose, Bld: 108 mg/dL — ABNORMAL HIGH (ref 70–99)

## 2011-05-28 NOTE — ED Notes (Signed)
The pt has been more sob for the past few days and he has had some diarrhea

## 2011-05-28 NOTE — ED Provider Notes (Signed)
History     CSN: [id number]  Arrival date & time 05/28/11  1925   First MD Initiated Contact with Patient 05/28/11 2149      Chief Complaint  Patient presents with  . Foot Swelling  . Weakness    (Consider location/radiation/quality/duration/timing/severity/associated sxs/prior treatment) HPI  Past Medical History  Diagnosis Date  . Myocardial infarct   . Coronary artery disease     Past Surgical History  Procedure Date  . Pacemaker insertion     History reviewed. No pertinent family history.  History  Substance Use Topics  . Smoking status: Never Smoker   . Smokeless tobacco: Not on file  . Alcohol Use: Yes      Review of Systems  Allergies  Review of patient's allergies indicates no known allergies.  Home Medications   Current Outpatient Rx  Name Route Sig Dispense Refill  . ASPIRIN 81 MG PO CHEW Oral Chew 81 mg by mouth daily.      Marland Kitchen CLOPIDOGREL BISULFATE 75 MG PO TABS Oral Take 75 mg by mouth daily.      Marland Kitchen DIPHENOXYLATE-ATROPINE 2.5-0.025 MG PO TABS Oral Take 1 tablet by mouth 4 (four) times daily.      . DONEPEZIL HCL 10 MG PO TABS Oral Take 10 mg by mouth at bedtime as needed.      Marland Kitchen METOPROLOL TARTRATE 100 MG PO TABS Oral Take 100 mg by mouth 2 (two) times daily.      . ICAPS PO Oral Take 1 tablet by mouth daily.      Carma Leaven M PLUS PO TABS Oral Take 1 tablet by mouth daily.        BP 132/88  Pulse 74  Temp(Src) 97.9 F (36.6 C) (Oral)  Resp 14  SpO2 98%  Physical Exam  ED Course  Procedures (including critical care time)  Labs Reviewed  CBC - Abnormal; Notable for the following:    RBC 3.54 (*)    Hemoglobin 12.1 (*)    HCT 35.9 (*)    MCV 101.4 (*)    MCH 34.2 (*)    All other components within normal limits  DIFFERENTIAL - Abnormal; Notable for the following:    Monocytes Relative 16 (*)    All other components within normal limits  BASIC METABOLIC PANEL - Abnormal; Notable for the following:    Glucose, Bld 108 (*)    GFR  calc non Af Amer 74 (*)    GFR calc Af Amer 86 (*)    All other components within normal limits  TROPONIN I  URINALYSIS, ROUTINE W REFLEX MICROSCOPIC   Dg Chest 2 View  05/28/2011  *RADIOLOGY REPORT*  Clinical Data: 77 year old male with shortness of breath.  Lower extremity swelling.  CHEST - 2 VIEW  Comparison: 01/19/2010 and earlier.  Findings: Stable large lung volumes. Stable cardiomegaly and mediastinal contours.  Right chest cardiac pacemaker is stable.  No pneumothorax, pulmonary edema, pleural effusion or acute pulmonary opacity. Stable visualized osseous structures.  IMPRESSION: Chronic cardiomegaly and pulmonary hyperinflation. No acute cardiopulmonary abnormality.  Original Report Authenticated By: Harley Hallmark, M.D.     No diagnosis found.    MDM  Duplicate note        Doug Sou, MD 05/29/11 [id number]

## 2011-05-28 NOTE — ED Notes (Signed)
Both feet swollen for approx 2-3 days..  No pain just does not feel well

## 2011-05-28 NOTE — ED Notes (Signed)
Received pt. From triage via w/c, pt. Ambulated to stretcher gait steady, NAD noted,

## 2011-05-28 NOTE — ED Provider Notes (Signed)
History     CSN: [id number]  Arrival date & time 05/28/11  1925   First MD Initiated Contact with Patient 05/28/11 2149      Chief Complaint  Patient presents with  . Foot Swelling  . Weakness    (Consider location/radiation/quality/duration/timing/severity/associated sxs/prior treatment) HPI History provided by pt and his family.  Pt reports that he has been "feeling bad" for the past few days.  Is unable to describe further, other than the fact that he has been generally weak.  His wife reports that he struggled to change his clothes for a party this evening and decided after already getting into car that he wasn't feeling well enough to go.  He has also had peripheral edema since yesterday.  Pt denies fever, CP, cough, N/V, urinary sx.  Has chronic and stable diarrhea.  Has possibly had some SOB but his wife did not notice any obvious dyspnea.  Has been eating and drinking fluids.  PMH MI in 2003 which, according to a family friend, presented with severe CP.  Per prior chart, most recent cath in 2006 which showed re-stenosis of LAD stent.  Had a pacemaker placed 03/2005 for symptomatic bradycardia.    Past Medical History  Diagnosis Date  . Myocardial infarct   . Coronary artery disease     Past Surgical History  Procedure Date  . Pacemaker insertion     History reviewed. No pertinent family history.  History  Substance Use Topics  . Smoking status: Never Smoker   . Smokeless tobacco: Not on file  . Alcohol Use: Yes      Review of Systems  All other systems reviewed and are negative.    Allergies  Review of patient's allergies indicates no known allergies.  Home Medications   Current Outpatient Rx  Name Route Sig Dispense Refill  . ASPIRIN 81 MG PO CHEW Oral Chew 81 mg by mouth daily.      Marland Kitchen CLOPIDOGREL BISULFATE 75 MG PO TABS Oral Take 75 mg by mouth daily.      Marland Kitchen DIPHENOXYLATE-ATROPINE 2.5-0.025 MG PO TABS Oral Take 1 tablet by mouth 4 (four) times daily.       . DONEPEZIL HCL 10 MG PO TABS Oral Take 10 mg by mouth at bedtime as needed.      Marland Kitchen METOPROLOL TARTRATE 100 MG PO TABS Oral Take 100 mg by mouth 2 (two) times daily.      . ICAPS PO Oral Take 1 tablet by mouth daily.      Carma Leaven M PLUS PO TABS Oral Take 1 tablet by mouth daily.        BP 132/88  Pulse 74  Temp(Src) 97.9 F (36.6 C) (Oral)  Resp 14  SpO2 98%  Physical Exam  Nursing note and vitals reviewed. Constitutional: He is oriented to person, place, and time. He appears well-developed and well-nourished. No distress.  HENT:  Head: Normocephalic and atraumatic.  Mouth/Throat: Oropharynx is clear and moist.  Eyes:       Normal appearance  Neck: Normal range of motion.  Cardiovascular: Normal rate and regular rhythm.   Pulmonary/Chest: Effort normal and breath sounds normal. No respiratory distress. He exhibits no tenderness.  Abdominal: Soft. Bowel sounds are normal. He exhibits no distension. There is no tenderness.       No CVA tenderness  Neurological: He is alert and oriented to person, place, and time.  Skin: Skin is warm and dry. No rash noted.  Psychiatric: He has  a normal mood and affect. His behavior is normal.    ED Course  Procedures (including critical care time)  Date: 05/29/2011  Rate: 75  Rhythm: paced  QRS Axis: normal  Intervals: normal  ST/T Wave abnormalities: normal  Conduction Disutrbances:none  Narrative Interpretation:   Old EKG Reviewed: unchanged   Labs Reviewed  CBC - Abnormal; Notable for the following:    RBC 3.54 (*)    Hemoglobin 12.1 (*)    HCT 35.9 (*)    MCV 101.4 (*)    MCH 34.2 (*)    All other components within normal limits  DIFFERENTIAL - Abnormal; Notable for the following:    Monocytes Relative 16 (*)    All other components within normal limits  BASIC METABOLIC PANEL - Abnormal; Notable for the following:    Glucose, Bld 108 (*)    GFR calc non Af Amer 74 (*)    GFR calc Af Amer 86 (*)    All other components  within normal limits  URINALYSIS, ROUTINE W REFLEX MICROSCOPIC - Abnormal; Notable for the following:    APPearance HAZY (*)    All other components within normal limits  TROPONIN I   Dg Chest 2 View  05/28/2011  *RADIOLOGY REPORT*  Clinical Data: 77 year old male with shortness of breath.  Lower extremity swelling.  CHEST - 2 VIEW  Comparison: 01/19/2010 and earlier.  Findings: Stable large lung volumes. Stable cardiomegaly and mediastinal contours.  Right chest cardiac pacemaker is stable.  No pneumothorax, pulmonary edema, pleural effusion or acute pulmonary opacity. Stable visualized osseous structures.  IMPRESSION: Chronic cardiomegaly and pulmonary hyperinflation. No acute cardiopulmonary abnormality.  Original Report Authenticated By: Harley Hallmark, M.D.     1. Peripheral edema       MDM  77yo M pt w/ h/o HTN, MI and bradycardia for which he is paced, presents w/ c/o generalized weakness and peripheral edema.  On exam, pt looks well, VS w/in nml range, hydrated, lungs clear, trace bilateral pitting edema.  CXR and labs, including troponin unremarkable.  Pt likely in mild heart failure.  [facility] called and has agreed to close follow up.  The office will contact him on Wednesday morning.  Pt d/c'd home w/ 20mg  lasix qam.  Return precautions discussed.        Otilio Miu, PA 05/29/11 0030  Doug Sou, MD 05/29/11 [phone]

## 2011-05-28 NOTE — ED Provider Notes (Signed)
Level V caveat patient demented. Wife and grandson report the patient's ankles have been swollen for the past several days. They're uncertain of how many days. Patient also comes tired with minimal exertion for the past several days. On exam patient is a pocket of alert no distress heart regular rate and rhythm lungs clear to auscultation abdomen nondistended. lower extremities with trace pitting ankle edema, nontender neurovascularly intact MDM Suspect occult congestive heart failure Plan gentle diuresis Cardiology followup  Doug Sou, MD 05/28/11 2359

## 2011-05-29 DIAGNOSIS — I499 Cardiac arrhythmia, unspecified: Secondary | ICD-10-CM

## 2011-05-29 HISTORY — DX: Cardiac arrhythmia, unspecified: I49.9

## 2011-05-29 LAB — TROPONIN I: Troponin I: <0.3 ng/mL (ref <0.30)

## 2011-05-29 MED ORDER — FUROSEMIDE 20 MG PO TABS: 20.0000 mg | ORAL_TABLET | Freq: Every day | ORAL | Status: DC

## 2011-05-29 NOTE — ED Notes (Signed)
Pt. Discharged to home with family via w/c, pt. Alert and oriented, NAD noted

## 2011-06-05 HISTORY — PX: NM MYOCAR PERF WALL MOTION: HXRAD629

## 2011-08-01 ENCOUNTER — Other Ambulatory Visit: Payer: Self-pay | Admitting: Unknown Physician Specialty

## 2011-08-01 ENCOUNTER — Inpatient Hospital Stay: Admission: RE | Admit: 2011-08-01 | Payer: Medicare Other | Source: Ambulatory Visit

## 2011-08-01 DIAGNOSIS — E049 Nontoxic goiter, unspecified: Secondary | ICD-10-CM

## 2011-08-10 ENCOUNTER — Encounter (HOSPITAL_COMMUNITY): Payer: Self-pay | Admitting: *Deleted

## 2011-08-10 ENCOUNTER — Inpatient Hospital Stay (HOSPITAL_COMMUNITY)
Admission: AD | Admit: 2011-08-10 | Discharge: 2011-08-16 | DRG: 287 | Disposition: A | Discharge status: Home / Self Care | Payer: Medicare Other | Source: Ambulatory Visit | Attending: Cardiovascular Disease | Admitting: Cardiovascular Disease

## 2011-08-10 ENCOUNTER — Other Ambulatory Visit: Payer: Self-pay

## 2011-08-10 DIAGNOSIS — I2489 Other forms of acute ischemic heart disease: Secondary | ICD-10-CM | POA: Diagnosis present

## 2011-08-10 DIAGNOSIS — R778 Other specified abnormalities of plasma proteins: Secondary | ICD-10-CM | POA: Diagnosis present

## 2011-08-10 DIAGNOSIS — I252 Old myocardial infarction: Secondary | ICD-10-CM

## 2011-08-10 DIAGNOSIS — I4891 Unspecified atrial fibrillation: Principal | ICD-10-CM | POA: Diagnosis present

## 2011-08-10 DIAGNOSIS — Z9861 Coronary angioplasty status: Secondary | ICD-10-CM

## 2011-08-10 DIAGNOSIS — I2510 Atherosclerotic heart disease of native coronary artery without angina pectoris: Secondary | ICD-10-CM | POA: Diagnosis present

## 2011-08-10 DIAGNOSIS — Z950 Presence of cardiac pacemaker: Secondary | ICD-10-CM | POA: Diagnosis present

## 2011-08-10 DIAGNOSIS — I10 Essential (primary) hypertension: Secondary | ICD-10-CM | POA: Diagnosis present

## 2011-08-10 DIAGNOSIS — Z79899 Other long term (current) drug therapy: Secondary | ICD-10-CM

## 2011-08-10 DIAGNOSIS — R7989 Other specified abnormal findings of blood chemistry: Secondary | ICD-10-CM | POA: Diagnosis present

## 2011-08-10 DIAGNOSIS — I248 Other forms of acute ischemic heart disease: Secondary | ICD-10-CM | POA: Diagnosis present

## 2011-08-10 DIAGNOSIS — I359 Nonrheumatic aortic valve disorder, unspecified: Secondary | ICD-10-CM | POA: Diagnosis present

## 2011-08-10 DIAGNOSIS — Z7982 Long term (current) use of aspirin: Secondary | ICD-10-CM

## 2011-08-10 HISTORY — DX: Cardiac arrhythmia, unspecified: I49.9

## 2011-08-10 HISTORY — DX: Unspecified osteoarthritis, unspecified site: M19.90

## 2011-08-10 HISTORY — DX: Bradycardia, unspecified: R00.1

## 2011-08-10 HISTORY — DX: Presence of cardiac pacemaker: Z95.0

## 2011-08-10 HISTORY — DX: Shortness of breath: R06.02

## 2011-08-10 HISTORY — DX: Benign prostatic hyperplasia without lower urinary tract symptoms: N40.0

## 2011-08-10 LAB — COMPREHENSIVE METABOLIC PANEL
ALT: 19 U/L (ref 0–53)
Albumin: 4.2 g/dL (ref 3.5–5.2)
Alkaline Phosphatase: 50 U/L (ref 39–117)
Chloride: 96 mEq/L (ref 96–112)
Glucose, Bld: 100 mg/dL — ABNORMAL HIGH (ref 70–99)
Potassium: 4.7 mEq/L (ref 3.5–5.1)
Sodium: 135 mEq/L (ref 135–145)
Total Bilirubin: 0.6 mg/dL (ref 0.3–1.2)
Total Protein: 6.5 g/dL (ref 6.0–8.3)

## 2011-08-10 LAB — CARDIAC PANEL(CRET KIN+CKTOT+MB+TROPI)
CK, MB: 6.3 ng/mL (ref 0.3–4.0)
Relative Index: INVALID (ref 0.0–2.5)
Total CK: 83 U/L (ref 7–232)
Troponin I: 0.72 ng/mL (ref <0.30)

## 2011-08-10 LAB — DIFFERENTIAL
Basophils Absolute: 0 10*3/uL (ref 0.0–0.1)
Basophils Relative: 1 % (ref 0–1)
Lymphocytes Relative: 21 % (ref 12–46)
Monocytes Absolute: 0.6 10*3/uL (ref 0.1–1.0)
Monocytes Relative: 11 % (ref 3–12)
Neutro Abs: 3.8 10*3/uL (ref 1.7–7.7)
Neutrophils Relative %: 66 % (ref 43–77)

## 2011-08-10 LAB — PROTIME-INR: INR: 1.15 (ref 0.00–1.49)

## 2011-08-10 LAB — CBC
HCT: 39.1 % (ref 39.0–52.0)
Hemoglobin: 13.5 g/dL (ref 13.0–17.0)
MCHC: 34.5 g/dL (ref 30.0–36.0)
WBC: 5.8 10*3/uL (ref 4.0–10.5)

## 2011-08-10 LAB — APTT: aPTT: 35 seconds (ref 24–37)

## 2011-08-10 MED ORDER — ASPIRIN EC 81 MG PO TBEC: 81.0000 mg | DELAYED_RELEASE_TABLET | Freq: Every day | ORAL | Status: DC

## 2011-08-10 MED ORDER — DIGOXIN 250 MCG PO TABS
0.2500 mg | ORAL_TABLET | Freq: Every day | ORAL | Status: DC
  Administered 2011-08-11 – 2011-08-12 (×2): 0.25 mg via ORAL
  Filled 2011-08-10 (×3): qty 1

## 2011-08-10 MED ORDER — ASPIRIN 81 MG PO CHEW
81.0000 mg | CHEWABLE_TABLET | Freq: Every day | ORAL | Status: DC
  Administered 2011-08-11 – 2011-08-16 (×6): 81 mg via ORAL
  Filled 2011-08-10 (×7): qty 1

## 2011-08-10 MED ORDER — HEPARIN (PORCINE) IN NACL 100-0.45 UNIT/ML-% IJ SOLN
750.0000 [IU]/h | INTRAMUSCULAR | Status: DC
  Administered 2011-08-10: 900 [IU]/h via INTRAVENOUS
  Administered 2011-08-11: 850 [IU]/h via INTRAVENOUS
  Administered 2011-08-13: 750 [IU]/h via INTRAVENOUS
  Filled 2011-08-10 (×5): qty 250

## 2011-08-10 MED ORDER — METOPROLOL TARTRATE 100 MG PO TABS
100.0000 mg | ORAL_TABLET | Freq: Two times a day (BID) | ORAL | Status: DC
  Administered 2011-08-10 – 2011-08-11 (×3): 100 mg via ORAL
  Filled 2011-08-10 (×5): qty 1

## 2011-08-10 MED ORDER — DIPHENOXYLATE-ATROPINE 2.5-0.025 MG PO TABS: 1.0000 | ORAL_TABLET | Freq: Four times a day (QID) | ORAL | Status: DC | PRN

## 2011-08-10 MED ORDER — ASPIRIN 81 MG PO CHEW
324.0000 mg | CHEWABLE_TABLET | ORAL | Status: AC
Start: 2011-08-10 — End: 2011-08-10
  Administered 2011-08-10: 243 mg via ORAL
  Filled 2011-08-10: qty 3

## 2011-08-10 MED ORDER — NITROGLYCERIN 0.4 MG SL SUBL: 0.4000 mg | SUBLINGUAL_TABLET | SUBLINGUAL | Status: DC | PRN

## 2011-08-10 MED ORDER — ACETAMINOPHEN 325 MG PO TABS: 650.0000 mg | ORAL_TABLET | ORAL | Status: DC | PRN

## 2011-08-10 MED ORDER — DILTIAZEM HCL 100 MG IV SOLR
5.0000 mg/h | INTRAVENOUS | Status: DC
  Administered 2011-08-10: 5 mg/h via INTRAVENOUS
  Filled 2011-08-10: qty 100

## 2011-08-10 MED ORDER — DONEPEZIL HCL 10 MG PO TABS
10.0000 mg | ORAL_TABLET | Freq: Every day | ORAL | Status: DC
  Administered 2011-08-11 – 2011-08-15 (×5): 10 mg via ORAL
  Filled 2011-08-10 (×7): qty 1

## 2011-08-10 MED ORDER — CLOPIDOGREL BISULFATE 75 MG PO TABS
75.0000 mg | ORAL_TABLET | Freq: Every day | ORAL | Status: DC
  Administered 2011-08-11 – 2011-08-13 (×3): 75 mg via ORAL
  Filled 2011-08-10 (×2): qty 1

## 2011-08-10 MED ORDER — ONDANSETRON HCL 4 MG/2ML IJ SOLN: 4.0000 mg | Freq: Four times a day (QID) | INTRAMUSCULAR | Status: DC | PRN

## 2011-08-10 MED ORDER — HEPARIN BOLUS VIA INFUSION
2500.0000 [IU] | Freq: Once | INTRAVENOUS | Status: AC
  Administered 2011-08-10: 2500 [IU] via INTRAVENOUS
  Filled 2011-08-10: qty 2500

## 2011-08-10 MED ORDER — ASPIRIN 300 MG RE SUPP
300.0000 mg | RECTAL | Status: AC
Start: 2011-08-10 — End: 2011-08-10
  Filled 2011-08-10: qty 1

## 2011-08-10 NOTE — H&P (Signed)
Patient ID: Johnathan [name] MRN: [id number], DOB/AGE: 1915/07/18   Admit date: (Not on file)   Primary Physician: Juline Patch, MD, MD Primary Cardiologist: Dr Allyson Sabal  HPI: 78 y/o  With a history of CAD s/p multiple PCIs. He has PAF and a pacemaker in place. He saw Dr Allyson Sabal in Jan 2012 and was doing well. He did have a Myoview then that was abnormal but the pt was doing well clinically. Yesterday his primary MD noted irreg, fast HR and an EKG showed AF with increased VR. Lanoxin was added to his medications. Labs were draw and Troponin came back high at 0.55, Dr Allyson Sabal initially wanted to treat him medically with Xarelto but now that his Troponin is positive he feels the patient should be admitted and put on Heparin and Cardizem.   Problem List: Past Medical History  Diagnosis Date  . Myocardial infarct   . Coronary artery disease     Past Surgical History  Procedure Date  . Pacemaker insertion      Allergies: No Known Allergies   Home Medications No prescriptions prior to admission   Metoprolol 100mg  BID Plavix 75mg   Aricept 10mg  QHS ASA 81mg  Furosemide 20mg   No family history on file.   History   Social History  . Marital Status: Widowed    Spouse Name: N/A    Number of Children: N/A  . Years of Education: N/A   Occupational History  . Not on file.   Social History Main Topics  . Smoking status: Never Smoker   . Smokeless tobacco: Not on file  . Alcohol Use: Yes  . Drug Use:   . Sexually Active:    Other Topics Concern  . Not on file   Social History Narrative  . No narrative on file     Review of Systems: not obtained as the pt is being admitted from home.  Physical Exam: (per Dr Allyson Sabal) There were no vitals taken for this visit.  General appearance: alert, cooperative and no distress Neck: no carotid bruit, no JVD and supple, symmetrical, trachea midline Lungs: clear to auscultation bilaterally Heart: irregularly irregular rhythm Abdomen: not  distended Extremities: no edema Pulses: 2+ and symmetric Skin: Skin color, texture, turgor normal. No rashes or lesions or cool and dry Neurologic: Grossly normal    Labs:  No results found for this or any previous visit (from the past 24 hour(s)).   Radiology/Studies: No results found.  EKG AF with CVR  ASSESSMENT AND PLAN:  Principal Problem:  *Atrial fibrillation Active Problems:  Troponin level elevated, 0.55  CAD (coronary artery disease), multiple PCI, abn Myoview 1/13  Ischemic cardiomyopathy, 40-45%  HTN (hypertension)  Plan- admit r/o MI, IV Heparin, Diltiazem as B/P tolerates, decrease beta blocker.  SignedAbelino Derrick, PA-C 08/10/2011, 6:26 PM  Thurmon Fair, MD, [facility] [phone] office [phone] pager

## 2011-08-10 NOTE — Progress Notes (Signed)
ANTICOAGULATION CONSULT NOTE - Initial Consult  Pharmacy Consult for heparin Indication: atrial fibrillation  No Known Allergies  Patient Measurements: Height: 5\' 9"  (175.3 cm) Weight: 141 lb 8.6 oz (64.2 kg) IBW/kg (Calculated) : 70.7  Heparin Dosing Weight:   Vital Signs: Temp: 97.5 F (36.4 C) (03/15 2007) Temp src: Oral (03/15 2007) BP: 103/71 mmHg (03/15 2007) Pulse Rate: 121  (03/15 2007)  Labs:  [facility] 08/10/11 2051  HGB 13.5  HCT 39.1  PLT 150  APTT 35  LABPROT 14.9  INR 1.15  HEPARINUNFRC --  CREATININE --  CKTOTAL --  CKMB --  TROPONINI --   Estimated Creatinine Clearance: 51.1 ml/min (by C-G formula based on Cr of 0.82).  Medical History: Past Medical History  Diagnosis Date  . Myocardial infarct   . Coronary artery disease     Medications:  Scheduled:    . aspirin  324 mg Oral NOW   Or  . aspirin  300 mg Rectal NOW  . aspirin  81 mg Oral Daily  . clopidogrel  75 mg Oral Daily  . digoxin  0.25 mg Oral Daily  . donepezil  10 mg Oral QHS  . heparin  2,500 Units Intravenous Once  . metoprolol  100 mg Oral BID  . DISCONTD: aspirin EC  81 mg Oral Daily    Assessment: 78 yr old male admitted by MD after EKG showed AF and Troponin lab came back high at 0.55. Goal of Therapy:  Heparin level 0.3-0.7 units/ml   Plan:  Will give small bolus of 2500 units and start heparin drip at 900 units/hr. Will f/u daily am heparin level and CBC.  Johnathan [name] 08/10/2011,9:41 PM

## 2011-08-11 ENCOUNTER — Other Ambulatory Visit: Payer: Self-pay

## 2011-08-11 ENCOUNTER — Inpatient Hospital Stay (HOSPITAL_COMMUNITY): Payer: Medicare Other

## 2011-08-11 LAB — BASIC METABOLIC PANEL
BUN: 18 mg/dL (ref 6–23)
CO2: 24 mEq/L (ref 19–32)
Calcium: 8.8 mg/dL (ref 8.4–10.5)
Chloride: 97 mEq/L (ref 96–112)
Creatinine, Ser: 0.92 mg/dL (ref 0.50–1.35)

## 2011-08-11 LAB — CBC
HCT: 37.6 % — ABNORMAL LOW (ref 39.0–52.0)
Hemoglobin: 12.6 g/dL — ABNORMAL LOW (ref 13.0–17.0)
MCH: 34.1 pg — ABNORMAL HIGH (ref 26.0–34.0)
MCV: 101.9 fL — ABNORMAL HIGH (ref 78.0–100.0)
Platelets: 141 10*3/uL — ABNORMAL LOW (ref 150–400)

## 2011-08-11 LAB — CARDIAC PANEL(CRET KIN+CKTOT+MB+TROPI): Troponin I: 0.74 ng/mL (ref <0.30)

## 2011-08-11 LAB — HEPARIN LEVEL (UNFRACTIONATED): Heparin Unfractionated: 0.67 IU/mL (ref 0.30–0.70)

## 2011-08-11 MED ORDER — DILTIAZEM HCL 60 MG PO TABS
60.0000 mg | ORAL_TABLET | Freq: Three times a day (TID) | ORAL | Status: DC
  Administered 2011-08-11 – 2011-08-14 (×9): 60 mg via ORAL
  Filled 2011-08-11 (×12): qty 1

## 2011-08-11 NOTE — Progress Notes (Signed)
ANTICOAGULATION CONSULT NOTE - Follow Up Consult  Pharmacy Consult for  Heparin Indication: atrial fibrillation  No Known Allergies  Patient Measurements: Height: 5\' 9"  (175.3 cm) Weight: 142 lb 6.7 oz (64.6 kg) IBW/kg (Calculated) : 70.7  Heparin Dosing Weight: 65 kg  Vital Signs: Temp: 97.8 F (36.6 C) (03/16 1200) Temp src: Oral (03/16 1200) BP: 94/68 mmHg (03/16 1200) Pulse Rate: 92  (03/16 1200)  Labs:  [facility] 08/11/11 1138 08/11/11 0500 08/11/11 0250 08/10/11 2051  HGB -- 12.6* -- 13.5  HCT -- 37.6* -- 39.1  PLT -- 141* -- 150  APTT -- -- -- 35  LABPROT -- -- -- 14.9  INR -- -- -- 1.15  HEPARINUNFRC 0.67 0.34 -- --  CREATININE -- 0.92 -- 1.04  CKTOTAL -- -- 75 83  CKMB -- -- 5.3* 6.3*  TROPONINI -- -- 0.74* 0.72*   Estimated Creatinine Clearance: 45.8 ml/min (by C-G formula based on Cr of 0.92).  Assessment:   Heparin level remains therapeutic on 900 units/hr, but has drifted to upper end of target range. Cardiac cath planned for Monday 3/18, then decisions on +/- DCCV and further anticoag plans. CBC down slightly, platelet count 150->141K.  Goal of Therapy:  Heparin level 0.3-0.7 units/ml   Plan:    Will decrease rate from 900 to 850 units/hr, to try to keep level at goal.  Next level and CBC in AM.  Watch platelet count.   Dennie Fetters, RPh Pager: [phone] 08/11/2011,1:17 PM

## 2011-08-11 NOTE — Progress Notes (Signed)
08/10/11 2200: Patient's CKMB: 6.3, and Troponin: 0.72. MD notified. Heparin infusing, no new orders needed at this point. Will continue to monitor.  Casey Burkitt, RN

## 2011-08-11 NOTE — Progress Notes (Signed)
ANTICOAGULATION CONSULT NOTE - Follow Up Consult  Pharmacy Consult for heparin Indication: atrial fibrillation  Labs:  [facility] 08/11/11 0500 08/10/11 2051  HGB 12.6* 13.5  HCT 37.6* 39.1  PLT 141* 150  APTT -- 35  LABPROT -- 14.9  INR -- 1.15  HEPARINUNFRC 0.34 --  CREATININE -- 1.04  CKTOTAL -- 83  CKMB -- 6.3*  TROPONINI -- 0.72*    Assessment/Plan: 78yo male therapeutic on heparin with initial dosing for Afib; on low end of goal but given age risk of accumulating.  Will continue at current rate and confirm stable with additional level.   Colleen Can PharmD BCPS 08/11/2011,6:40 AM

## 2011-08-11 NOTE — Progress Notes (Signed)
[facility]  DAILY PROGRESS NOTE   Subjective:  Feels much better. Emotional and poor short term memory, but family at bedside really helps.  Excellent rate control overnight.  Objective:  Temp:  [97.4 F (36.3 C)-97.8 F (36.6 C)] 97.8 F (36.6 C) (03/16 0800) Pulse Rate:  [62-129] 62  (03/16 1005) Resp:  [16-24] 18  (03/16 0800) BP: (102-121)/(60-93) 102/60 mmHg (03/16 1005) SpO2:  [93 %-100 %] 93 % (03/16 0800) Weight:  [64.2 kg (141 lb 8.6 oz)-65.6 kg (144 lb 10 oz)] 64.6 kg (142 lb 6.7 oz) (03/16 0500) Weight change:   Intake/Output from previous day: 03/15 0701 - 03/16 0700 In: 119.7 [I.V.:119.7] Out: -   Intake/Output from this shift: Total I/O In: 480 [P.O.:480] Out: -   Medications: Current Facility-Administered Medications  Medication Dose Route Frequency Provider Last Rate Last Dose  . acetaminophen (TYLENOL) tablet 650 mg  650 mg Oral Q4H PRN Abelino Derrick, PA      . aspirin chewable tablet 324 mg  324 mg Oral NOW [place] Warriner, MD   243 mg at 08/10/11 2246   Or  . aspirin suppository 300 mg  300 mg Rectal NOW Jafeth Mustin, MD      . aspirin chewable tablet 81 mg  81 mg Oral Daily Indie Boehne, MD   81 mg at 08/11/11 1005  . clopidogrel (PLAVIX) tablet 75 mg  75 mg Oral Daily Levern Pitter, MD   75 mg at 08/11/11 1005  . digoxin (LANOXIN) tablet 0.25 mg  0.25 mg Oral Daily Barlow Harrison, MD   0.25 mg at 08/11/11 1005  . diltiazem (CARDIZEM) tablet 60 mg  60 mg Oral Q8H Anthon Harpole, MD      . diphenoxylate-atropine (LOMOTIL) 2.5-0.025 MG per tablet 1 tablet  1 tablet Oral QID PRN Beyounce Dickens, MD      . donepezil (ARICEPT) tablet 10 mg  10 mg Oral QHS Carmin Alvidrez, MD      . heparin ADULT infusion 100 units/mL (25000 units/250 mL)  900 Units/hr Intravenous Continuous Runell Gess, MD 9 mL/hr at 08/10/11 2235 900 Units/hr at 08/10/11 2235  . heparin bolus via infusion 2,500 Units  2,500 Units Intravenous Once  Runell Gess, MD   2,500 Units at 08/10/11 2241  . metoprolol (LOPRESSOR) tablet 100 mg  100 mg Oral BID Shazia Mitchener, MD   100 mg at 08/11/11 1005  . nitroGLYCERIN (NITROSTAT) SL tablet 0.4 mg  0.4 mg Sublingual Q5 Min x 3 PRN Kendarius Vigen, MD      . ondansetron (ZOFRAN) injection 4 mg  4 mg Intravenous Q6H PRN Koree Schopf, MD      . DISCONTD: acetaminophen (TYLENOL) tablet 650 mg  650 mg Oral Q4H PRN Lakin Romer, MD      . DISCONTD: aspirin EC tablet 81 mg  81 mg Oral Daily Damain Broadus, MD      . DISCONTD: diltiazem (CARDIZEM) 100 mg in dextrose 5 % 100 mL infusion  5-15 mg/hr Intravenous Continuous Easten Maceachern, MD 10 mL/hr at 08/10/11 2330 10 mg/hr at 08/10/11 2330    Physical Exam: General appearance: alert, cooperative and no distress Neck: no adenopathy, no carotid bruit, no JVD, supple, symmetrical, trachea midline and thyroid not enlarged, symmetric, no tenderness/mass/nodules Lungs: clear to auscultation bilaterally Heart: normal apical impulse, regular rate and rhythm, S1, S2 normal and no S3 or S4 Abdomen: soft, non-tender; bowel sounds normal; no masses,  no organomegaly Extremities: extremities normal,  atraumatic, no cyanosis or edema Pulses: 2+ and symmetric Skin: Skin color, texture, turgor normal. No rashes or lesions or healthy right subclavian PM site Neurologic: Alert and oriented X 3, normal strength and tone. Normal symmetric reflexes. Normal coordination and gait. Easily distracted and emotional. Poor short term memory.  Lab Results: Results for orders placed during the hospital encounter of 08/10/11 (from the past 48 hour(s))  CARDIAC PANEL(CRET KIN+CKTOT+MB+TROPI)     Status: Abnormal   Collection Time   08/10/11  8:51 PM      Component Value Range Comment   Total CK 83  7 - 232 (U/L)    CK, MB 6.3 (*) 0.3 - 4.0 (ng/mL)    Troponin I 0.72 (*) <0.30 (ng/mL)    Relative Index RELATIVE INDEX IS INVALID  0.0 - 2.5    PROTIME-INR     Status: Normal     Collection Time   08/10/11  8:51 PM      Component Value Range Comment   Prothrombin Time 14.9  11.6 - 15.2 (seconds)    INR 1.15  0.00 - 1.49    APTT     Status: Normal   Collection Time   08/10/11  8:51 PM      Component Value Range Comment   aPTT 35  24 - 37 (seconds)   CBC     Status: Abnormal   Collection Time   08/10/11  8:51 PM      Component Value Range Comment   WBC 5.8  4.0 - 10.5 (K/uL)    RBC 3.90 (*) 4.22 - 5.81 (MIL/uL)    Hemoglobin 13.5  13.0 - 17.0 (g/dL)    HCT 08.6  57.8 - 46.9 (%)    MCV 100.3 (*) 78.0 - 100.0 (fL)    MCH 34.6 (*) 26.0 - 34.0 (pg)    MCHC 34.5  30.0 - 36.0 (g/dL)    RDW 62.9  52.8 - 41.3 (%)    Platelets 150  150 - 400 (K/uL)   DIFFERENTIAL     Status: Normal   Collection Time   08/10/11  8:51 PM      Component Value Range Comment   Neutrophils Relative 66  43 - 77 (%)    Neutro Abs 3.8  1.7 - 7.7 (K/uL)    Lymphocytes Relative 21  12 - 46 (%)    Lymphs Abs 1.2  0.7 - 4.0 (K/uL)    Monocytes Relative 11  3 - 12 (%)    Monocytes Absolute 0.6  0.1 - 1.0 (K/uL)    Eosinophils Relative 2  0 - 5 (%)    Eosinophils Absolute 0.1  0.0 - 0.7 (K/uL)    Basophils Relative 1  0 - 1 (%)    Basophils Absolute 0.0  0.0 - 0.1 (K/uL)   TSH     Status: Abnormal   Collection Time   08/10/11  8:51 PM      Component Value Range Comment   TSH 5.172 (*) 0.350 - 4.500 (uIU/mL)   COMPREHENSIVE METABOLIC PANEL     Status: Abnormal   Collection Time   08/10/11  8:51 PM      Component Value Range Comment   Sodium 135  135 - 145 (mEq/L)    Potassium 4.7  3.5 - 5.1 (mEq/L)    Chloride 96  96 - 112 (mEq/L)    CO2 28  19 - 32 (mEq/L)    Glucose, Bld 100 (*) 70 - 99 (mg/dL)  BUN 20  6 - 23 (mg/dL)    Creatinine, Ser 2.13  0.50 - 1.35 (mg/dL)    Calcium 9.3  8.4 - 10.5 (mg/dL)    Total Protein 6.5  6.0 - 8.3 (g/dL)    Albumin 4.2  3.5 - 5.2 (g/dL)    AST 31  0 - 37 (U/L)    ALT 19  0 - 53 (U/L)    Alkaline Phosphatase 50  39 - 117 (U/L)    Total Bilirubin  0.6  0.3 - 1.2 (mg/dL)    GFR calc non Af Amer 60 (*) >90 (mL/min)    GFR calc Af Amer 69 (*) >90 (mL/min)   MAGNESIUM     Status: Normal   Collection Time   08/10/11  8:51 PM      Component Value Range Comment   Magnesium 2.2  1.5 - 2.5 (mg/dL)   CARDIAC PANEL(CRET KIN+CKTOT+MB+TROPI)     Status: Abnormal   Collection Time   08/11/11  2:50 AM      Component Value Range Comment   Total CK 75  7 - 232 (U/L)    CK, MB 5.3 (*) 0.3 - 4.0 (ng/mL)    Troponin I 0.74 (*) <0.30 (ng/mL)    Relative Index RELATIVE INDEX IS INVALID  0.0 - 2.5    BASIC METABOLIC PANEL     Status: Abnormal   Collection Time   08/11/11  5:00 AM      Component Value Range Comment   Sodium 135  135 - 145 (mEq/L)    Potassium 3.7  3.5 - 5.1 (mEq/L)    Chloride 97  96 - 112 (mEq/L)    CO2 24  19 - 32 (mEq/L)    Glucose, Bld 99  70 - 99 (mg/dL)    BUN 18  6 - 23 (mg/dL)    Creatinine, Ser 0.86  0.50 - 1.35 (mg/dL)    Calcium 8.8  8.4 - 10.5 (mg/dL)    GFR calc non Af Amer 70 (*) >90 (mL/min)    GFR calc Af Amer 82 (*) >90 (mL/min)   HEPARIN LEVEL (UNFRACTIONATED)     Status: Normal   Collection Time   08/11/11  5:00 AM      Component Value Range Comment   Heparin Unfractionated 0.34  0.30 - 0.70 (IU/mL)   CBC     Status: Abnormal   Collection Time   08/11/11  5:00 AM      Component Value Range Comment   WBC 5.8  4.0 - 10.5 (K/uL)    RBC 3.69 (*) 4.22 - 5.81 (MIL/uL)    Hemoglobin 12.6 (*) 13.0 - 17.0 (g/dL)    HCT 57.8 (*) 46.9 - 52.0 (%)    MCV 101.9 (*) 78.0 - 100.0 (fL)    MCH 34.1 (*) 26.0 - 34.0 (pg)    MCHC 33.5  30.0 - 36.0 (g/dL)    RDW 62.9  52.8 - 41.3 (%)    Platelets 141 (*) 150 - 400 (K/uL)     Imaging: Dg Chest 2 View  08/11/2011  *RADIOLOGY REPORT*  Clinical Data: Non ST elevation myocardial infarction; atrial fibrillation.  CHEST - 2 VIEW  Comparison: Chest radiograph performed 05/28/2011  Findings: The lungs are well-aerated.  Mildly increased interstitial markings are noted.  There is no  evidence of focal opacification, pleural effusion or pneumothorax.  The heart is borderline enlarged; calcification is noted within the aortic arch.  A pacemaker is seen at the right chest  wall, with leads ending at the right atrium and right ventricle.  No acute osseous abnormalities are seen.  IMPRESSION: Borderline cardiomegaly; no acute cardiopulmonary process seen.  Original Report Authenticated By: Tonia Ghent, M.D.    Assessment:  1. Principal Problem: 2.  *Atrial fibrillation - improved rate control on iv dilt, with better BP 3. Active Problems: 4.  Troponin level elevated, 0.55 - demand ischemia versus tiny NSTEMI? 5.  CAD (coronary artery disease), multiple PCI, abn Myoview 1/13 6.  Ischemic cardiomyopathy, 40-45% 7.  HTN (hypertension) 8.  Pacemaker 9.   Plan:  1. Coronary angiography on Monday. Nuclear study in January did suggest LAD territory ischemia in addition to old MI/scar. Minimal increase in enzymes could be due to demand ischemia for AF with RVR, but cannot exclude acute coronary syndrome as trigger for AF. 2. Switch diltiazem to PO 3. Continue IV heparin; decision on anticoagulation long term depends in part on cath findings 4. Consider cardioversion for AF. AF of approximately 48 hours duration before anticoagulation. May do TEE guided DCCV or defer for 3 weeks of standard anticoagulation. Decision will depend on symptoms and results of cath. 5. Monitor for signs/symptoms of CHF. Currently appears euvolemic, compensated.  Time Spent Directly with Patient:  60 minutes  Length of Stay:  LOS: 1 day    Siara Gorder 08/11/2011, 10:10 AM

## 2011-08-11 NOTE — Progress Notes (Signed)
  Echocardiogram 2D Echocardiogram has been performed.  Jorje Guild [facility] 08/11/2011, 2:47 PM

## 2011-08-12 ENCOUNTER — Other Ambulatory Visit: Payer: Self-pay

## 2011-08-12 LAB — CBC
MCH: 33.7 pg (ref 26.0–34.0)
MCH: 34.5 pg — ABNORMAL HIGH (ref 26.0–34.0)
MCHC: 33.5 g/dL (ref 30.0–36.0)
MCHC: 34.2 g/dL (ref 30.0–36.0)
MCV: 100.6 fL — ABNORMAL HIGH (ref 78.0–100.0)
Platelets: 131 10*3/uL — ABNORMAL LOW (ref 150–400)
RDW: 13.6 % (ref 11.5–15.5)
RDW: 13.6 % (ref 11.5–15.5)
WBC: 6.1 10*3/uL (ref 4.0–10.5)

## 2011-08-12 LAB — BASIC METABOLIC PANEL
BUN: 16 mg/dL (ref 6–23)
Calcium: 8.6 mg/dL (ref 8.4–10.5)
Creatinine, Ser: 0.75 mg/dL (ref 0.50–1.35)
GFR calc Af Amer: 89 mL/min — ABNORMAL LOW (ref >90)
GFR calc non Af Amer: 77 mL/min — ABNORMAL LOW (ref >90)
Glucose, Bld: 95 mg/dL (ref 70–99)
Potassium: 3.6 mEq/L (ref 3.5–5.1)

## 2011-08-12 MED ORDER — METOPROLOL TARTRATE 25 MG PO TABS
125.0000 mg | ORAL_TABLET | Freq: Two times a day (BID) | ORAL | Status: DC
  Administered 2011-08-12 – 2011-08-16 (×9): 125 mg via ORAL
  Filled 2011-08-12 (×10): qty 1

## 2011-08-12 MED ORDER — SODIUM CHLORIDE 0.9 % IJ SOLN: 3.0000 mL | INTRAMUSCULAR | Status: DC | PRN

## 2011-08-12 MED ORDER — ASPIRIN 81 MG PO CHEW: 324.0000 mg | CHEWABLE_TABLET | ORAL | Status: DC

## 2011-08-12 MED ORDER — SODIUM CHLORIDE 0.9 % IV SOLN: 1.0000 mL/kg/h | INTRAVENOUS | Status: DC

## 2011-08-12 MED ORDER — SODIUM CHLORIDE 0.9 % IJ SOLN
3.0000 mL | Freq: Two times a day (BID) | INTRAMUSCULAR | Status: DC
  Administered 2011-08-12: 3 mL via INTRAVENOUS

## 2011-08-12 MED ORDER — DIGOXIN 125 MCG PO TABS
0.1250 mg | ORAL_TABLET | Freq: Every day | ORAL | Status: DC
  Administered 2011-08-13 – 2011-08-16 (×4): 0.125 mg via ORAL
  Filled 2011-08-12 (×4): qty 1

## 2011-08-12 MED ORDER — FUROSEMIDE 80 MG PO TABS
80.0000 mg | ORAL_TABLET | Freq: Every day | ORAL | Status: AC
  Administered 2011-08-12: 80 mg via ORAL
  Filled 2011-08-12: qty 1

## 2011-08-12 MED ORDER — ASPIRIN 81 MG PO CHEW
324.0000 mg | CHEWABLE_TABLET | ORAL | Status: AC
  Administered 2011-08-13: 324 mg via ORAL
  Filled 2011-08-12: qty 4

## 2011-08-12 MED ORDER — SIMETHICONE 80 MG PO CHEW
80.0000 mg | CHEWABLE_TABLET | Freq: Four times a day (QID) | ORAL | Status: DC | PRN
  Administered 2011-08-12 – 2011-08-14 (×2): 80 mg via ORAL
  Filled 2011-08-12 (×3): qty 1

## 2011-08-12 MED ORDER — SODIUM CHLORIDE 0.9 % IV SOLN: 250.0000 mL | INTRAVENOUS | Status: DC | PRN

## 2011-08-12 MED ORDER — SODIUM CHLORIDE 0.9 % IJ SOLN: 3.0000 mL | Freq: Two times a day (BID) | INTRAMUSCULAR | Status: DC

## 2011-08-12 MED ORDER — DIGOXIN 250 MCG PO TABS
0.2500 mg | ORAL_TABLET | Freq: Once | ORAL | Status: DC
  Filled 2011-08-12: qty 1

## 2011-08-12 NOTE — Progress Notes (Signed)
[facility]  DAILY PROGRESS NOTE   Subjective:  Less dyspneic. No angina. Complains of abdominal bloating.  Rate control overnight has not been as good: HR commonly creeping up to 110s..  Objective:  Temp:  [97.3 F (36.3 C)-97.8 F (36.6 C)] 97.5 F (36.4 C) (03/17 0800) Pulse Rate:  [62-118] 80  (03/17 0800) Resp:  [18-20] 20  (03/17 0800) BP: (94-122)/(60-80) 112/66 mmHg (03/17 0800) SpO2:  [91 %-99 %] 99 % (03/17 0800) Weight:  [65.6 kg (144 lb 10 oz)] 65.6 kg (144 lb 10 oz) (03/17 0530) Weight change: 0 kg (0 lb)  Intake/Output from previous day: 03/16 0701 - 03/17 0700 In: 1427.7 [P.O.:1200; I.V.:227.7] Out: -   Intake/Output from this shift: Total I/O In: 360 [P.O.:360] Out: -   Medications: Current Facility-Administered Medications  Medication Dose Route Frequency Provider Last Rate Last Dose  . acetaminophen (TYLENOL) tablet 650 mg  650 mg Oral Q4H PRN Abelino Derrick, PA      . aspirin chewable tablet 81 mg  81 mg Oral Daily Dwana Melena, PA   81 mg at 08/11/11 1005  . clopidogrel (PLAVIX) tablet 75 mg  75 mg Oral Daily Keylen Eckenrode, MD   75 mg at 08/11/11 1005  . digoxin (LANOXIN) tablet 0.25 mg  0.25 mg Oral Daily Cuahutemoc Attar, MD   0.25 mg at 08/11/11 1005  . diltiazem (CARDIZEM) tablet 60 mg  60 mg Oral Q8H Daysie Helf, MD   60 mg at 08/12/11 0537  . diphenoxylate-atropine (LOMOTIL) 2.5-0.025 MG per tablet 1 tablet  1 tablet Oral QID PRN Modine Oppenheimer, MD      . donepezil (ARICEPT) tablet 10 mg  10 mg Oral QHS Jaymarie Yeakel, MD   10 mg at 08/11/11 2139  . heparin ADULT infusion 100 units/mL (25000 units/250 mL)  750 Units/hr Intravenous Continuous Delontae Lamm, MD 8.5 mL/hr at 08/12/11 0700 850 Units/hr at 08/12/11 0700  . metoprolol tartrate (LOPRESSOR) tablet 125 mg  125 mg Oral BID Marchella Hibbard, MD      . nitroGLYCERIN (NITROSTAT) SL tablet 0.4 mg  0.4 mg Sublingual Q5 Min x 3 PRN Abijah Roussel, MD      .  ondansetron (ZOFRAN) injection 4 mg  4 mg Intravenous Q6H PRN Charlita Brian, MD      . DISCONTD: diltiazem (CARDIZEM) 100 mg in dextrose 5 % 100 mL infusion  5-15 mg/hr Intravenous Continuous Robbye Dede, MD 10 mL/hr at 08/10/11 2330 10 mg/hr at 08/10/11 2330  . DISCONTD: metoprolol (LOPRESSOR) tablet 100 mg  100 mg Oral BID Thurmon Fair, MD   100 mg at 08/11/11 2139    Physical Exam: General appearance: alert, cooperative and no distress Neck: no adenopathy, no carotid bruit, no JVD, supple, symmetrical, trachea midline and thyroid not enlarged, symmetric, no tenderness/mass/nodules Lungs: clear to auscultation bilaterally Heart: normal apical impulse, regular rate and rhythm, S1, S2 normal and no S3 or S4 Abdomen: soft, non-tender; bowel sounds normal; no masses,  no organomegaly Extremities: extremities normal, atraumatic, no cyanosis or edema Pulses: 2+ and symmetric Skin: Skin color, texture, turgor normal. No rashes or lesions or healthy right subclavian PM site Neurologic: Alert and oriented X 3, normal strength and tone. Normal symmetric reflexes. Normal coordination and gait. Easily distracted and emotional. Poor short term memory.  Lab Results: Results for orders placed during the hospital encounter of 08/10/11 (from the past 48 hour(s))  CARDIAC PANEL(CRET KIN+CKTOT+MB+TROPI)     Status: Abnormal  Collection Time   08/10/11  8:51 PM      Component Value Range Comment   Total CK 83  7 - 232 (U/L)    CK, MB 6.3 (*) 0.3 - 4.0 (ng/mL)    Troponin I 0.72 (*) <0.30 (ng/mL)    Relative Index RELATIVE INDEX IS INVALID  0.0 - 2.5    PROTIME-INR     Status: Normal   Collection Time   08/10/11  8:51 PM      Component Value Range Comment   Prothrombin Time 14.9  11.6 - 15.2 (seconds)    INR 1.15  0.00 - 1.49    APTT     Status: Normal   Collection Time   08/10/11  8:51 PM      Component Value Range Comment   aPTT 35  24 - 37 (seconds)   CBC     Status: Abnormal   Collection  Time   08/10/11  8:51 PM      Component Value Range Comment   WBC 5.8  4.0 - 10.5 (K/uL)    RBC 3.90 (*) 4.22 - 5.81 (MIL/uL)    Hemoglobin 13.5  13.0 - 17.0 (g/dL)    HCT 40.9  81.1 - 91.4 (%)    MCV 100.3 (*) 78.0 - 100.0 (fL)    MCH 34.6 (*) 26.0 - 34.0 (pg)    MCHC 34.5  30.0 - 36.0 (g/dL)    RDW 78.2  95.6 - 21.3 (%)    Platelets 150  150 - 400 (K/uL)   DIFFERENTIAL     Status: Normal   Collection Time   08/10/11  8:51 PM      Component Value Range Comment   Neutrophils Relative 66  43 - 77 (%)    Neutro Abs 3.8  1.7 - 7.7 (K/uL)    Lymphocytes Relative 21  12 - 46 (%)    Lymphs Abs 1.2  0.7 - 4.0 (K/uL)    Monocytes Relative 11  3 - 12 (%)    Monocytes Absolute 0.6  0.1 - 1.0 (K/uL)    Eosinophils Relative 2  0 - 5 (%)    Eosinophils Absolute 0.1  0.0 - 0.7 (K/uL)    Basophils Relative 1  0 - 1 (%)    Basophils Absolute 0.0  0.0 - 0.1 (K/uL)   TSH     Status: Abnormal   Collection Time   08/10/11  8:51 PM      Component Value Range Comment   TSH 5.172 (*) 0.350 - 4.500 (uIU/mL)   COMPREHENSIVE METABOLIC PANEL     Status: Abnormal   Collection Time   08/10/11  8:51 PM      Component Value Range Comment   Sodium 135  135 - 145 (mEq/L)    Potassium 4.7  3.5 - 5.1 (mEq/L)    Chloride 96  96 - 112 (mEq/L)    CO2 28  19 - 32 (mEq/L)    Glucose, Bld 100 (*) 70 - 99 (mg/dL)    BUN 20  6 - 23 (mg/dL)    Creatinine, Ser 0.86  0.50 - 1.35 (mg/dL)    Calcium 9.3  8.4 - 10.5 (mg/dL)    Total Protein 6.5  6.0 - 8.3 (g/dL)    Albumin 4.2  3.5 - 5.2 (g/dL)    AST 31  0 - 37 (U/L)    ALT 19  0 - 53 (U/L)    Alkaline Phosphatase 50  39 - 117 (  U/L)    Total Bilirubin 0.6  0.3 - 1.2 (mg/dL)    GFR calc non Af Amer 60 (*) >90 (mL/min)    GFR calc Af Amer 69 (*) >90 (mL/min)   MAGNESIUM     Status: Normal   Collection Time   08/10/11  8:51 PM      Component Value Range Comment   Magnesium 2.2  1.5 - 2.5 (mg/dL)   CARDIAC PANEL(CRET KIN+CKTOT+MB+TROPI)     Status: Abnormal    Collection Time   08/11/11  2:50 AM      Component Value Range Comment   Total CK 75  7 - 232 (U/L)    CK, MB 5.3 (*) 0.3 - 4.0 (ng/mL)    Troponin I 0.74 (*) <0.30 (ng/mL)    Relative Index RELATIVE INDEX IS INVALID  0.0 - 2.5    BASIC METABOLIC PANEL     Status: Abnormal   Collection Time   08/11/11  5:00 AM      Component Value Range Comment   Sodium 135  135 - 145 (mEq/L)    Potassium 3.7  3.5 - 5.1 (mEq/L)    Chloride 97  96 - 112 (mEq/L)    CO2 24  19 - 32 (mEq/L)    Glucose, Bld 99  70 - 99 (mg/dL)    BUN 18  6 - 23 (mg/dL)    Creatinine, Ser 4.09  0.50 - 1.35 (mg/dL)    Calcium 8.8  8.4 - 10.5 (mg/dL)    GFR calc non Af Amer 70 (*) >90 (mL/min)    GFR calc Af Amer 82 (*) >90 (mL/min)   HEPARIN LEVEL (UNFRACTIONATED)     Status: Normal   Collection Time   08/11/11  5:00 AM      Component Value Range Comment   Heparin Unfractionated 0.34  0.30 - 0.70 (IU/mL)   CBC     Status: Abnormal   Collection Time   08/11/11  5:00 AM      Component Value Range Comment   WBC 5.8  4.0 - 10.5 (K/uL)    RBC 3.69 (*) 4.22 - 5.81 (MIL/uL)    Hemoglobin 12.6 (*) 13.0 - 17.0 (g/dL)    HCT 81.1 (*) 91.4 - 52.0 (%)    MCV 101.9 (*) 78.0 - 100.0 (fL)    MCH 34.1 (*) 26.0 - 34.0 (pg)    MCHC 33.5  30.0 - 36.0 (g/dL)    RDW 78.2  95.6 - 21.3 (%)    Platelets 141 (*) 150 - 400 (K/uL)   HEPARIN LEVEL (UNFRACTIONATED)     Status: Normal   Collection Time   08/11/11 11:38 AM      Component Value Range Comment   Heparin Unfractionated 0.67  0.30 - 0.70 (IU/mL)   HEPARIN LEVEL (UNFRACTIONATED)     Status: Abnormal   Collection Time   08/12/11  5:50 AM      Component Value Range Comment   Heparin Unfractionated 0.74 (*) 0.30 - 0.70 (IU/mL)   CBC     Status: Abnormal   Collection Time   08/12/11  5:50 AM      Component Value Range Comment   WBC 6.1  4.0 - 10.5 (K/uL)    RBC 3.62 (*) 4.22 - 5.81 (MIL/uL)    Hemoglobin 12.2 (*) 13.0 - 17.0 (g/dL)    HCT 08.6 (*) 57.8 - 52.0 (%)    MCV 100.6 (*)  78.0 - 100.0 (fL)    MCH 33.7  26.0 - 34.0 (pg)    MCHC 33.5  30.0 - 36.0 (g/dL)    RDW 16.1  09.6 - 04.5 (%)    Platelets 131 (*) 150 - 400 (K/uL)     Imaging: Dg Chest 2 View  08/11/2011  *RADIOLOGY REPORT*  Clinical Data: Non ST elevation myocardial infarction; atrial fibrillation.  CHEST - 2 VIEW  Comparison: Chest radiograph performed 05/28/2011  Findings: The lungs are well-aerated.  Mildly increased interstitial markings are noted.  There is no evidence of focal opacification, pleural effusion or pneumothorax.  The heart is borderline enlarged; calcification is noted within the aortic arch.  A pacemaker is seen at the right chest wall, with leads ending at the right atrium and right ventricle.  No acute osseous abnormalities are seen.  IMPRESSION: Borderline cardiomegaly; no acute cardiopulmonary process seen.  Original Report Authenticated By: Tonia Ghent, M.D.   ECHO: - Left ventricle: The cavity size was normal. Wall thickness was normal. Systolic function was moderately reduced. The estimated ejection fraction was in the range of 35% to 40%. Severe hypokinesis of the mid-distal anterior and apical myocardium. - Aortic valve: Transvalvular velocity was increased less than expected, due to low cardiac output. There was mild stenosis. Trivial regurgitation. Valve area: 1.43cm^2 - Mitral valve: Mild regurgitation. The acceleration rate of the regurgitant jet was reduced, consistent with a low dP/dt. - Left atrium: The atrium was severely dilated. - Right ventricle: Systolic function was mildly reduced. - Right atrium: The atrium was severely dilated. - Pulmonary arteries: Systolic pressure was moderately increased. PA peak pressure: 53mm Hg (S).    Assessment:  1. Principal Problem: 2.  *Atrial fibrillation with RVR 3. Active Problems: 4.  Troponin level elevated, 0.55 - demand ischemia versus tiny NSTEMI? 5.  CAD (coronary artery disease), multiple PCI, abn Myoview 1/13  anterior scar w ischemia 6.  Ischemic cardiomyopathy, 40-45% 7.  HTN (hypertension) 8.  Pacemaker 9. Mild aortic stenosis 10. Severe biatrial enlargement 11. Early dementia  Plan: 1. Coronary angiography on Monday. Nuclear study in January did suggest LAD territory ischemia in addition to old MI/scar. Minimal increase in enzymes could be due to demand ischemia for AF with RVR, but cannot exclude acute coronary syndrome as trigger for AF. 2. AF with RVR: beta blocker, digoxin and diltiazem with mediocre rate control. Prefer to increase beta blocker in setting of CAD, possible ACS, depressed EF. Will probably need Amio to achieve good rate control, but reluctant to add until we do TEE and exclude LA clot; decision on anticoagulation long term depends in part on cath findings/PCI. 3. Consider cardioversion for AF. Odds of long term maintenance of SR are less than average due to size of atria, but AF seems to have had major clinical impact. Will need amiodarone to have any chance of SR maintenance. 4. AF of approximately 48 hours duration before anticoagulation. May do TEE guided DCCV or defer for 3 weeks of standard anticoagulation. Decision will depend on symptoms and results of cath. 5. Monitor for signs/symptoms of CHF. Currently appears euvolemic, compensated from clinical standpoint, but severely dilated IVC suggests hypervolemia. Will give one dose of diuretics.  Time Spent Directly with Patient:  60 minutes  Length of Stay:  LOS: 2 days    Romeo Zielinski 08/12/2011, 9:42 AM

## 2011-08-12 NOTE — Progress Notes (Signed)
ANTICOAGULATION CONSULT NOTE - Follow Up Consult  Pharmacy Consult for  Heparin Indication: atrial fibrillation  No Known Allergies  Patient Measurements: Height: 5\' 9"  (175.3 cm) Weight: 144 lb 10 oz (65.6 kg) IBW/kg (Calculated) : 70.7  Heparin Dosing Weight: 65 kg  Vital Signs: Temp: 97.5 F (36.4 C) (03/17 0800) Temp src: Oral (03/17 0800) BP: 112/66 mmHg (03/17 0800) Pulse Rate: 80  (03/17 0800)  Labs:  Basename 08/12/11 0550 08/11/11 1138 08/11/11 0500 08/11/11 0250 08/10/11 2051  HGB 12.2* -- 12.6* -- --  HCT 36.4* -- 37.6* -- 39.1  PLT 131* -- 141* -- 150  APTT -- -- -- -- 35  LABPROT -- -- -- -- 14.9  INR -- -- -- -- 1.15  HEPARINUNFRC 0.74* 0.67 0.34 -- --  CREATININE -- -- 0.92 -- 1.04  CKTOTAL -- -- -- 75 83  CKMB -- -- -- 5.3* 6.3*  TROPONINI -- -- -- 0.74* 0.72*   Estimated Creatinine Clearance: 46.5 ml/min (by C-G formula based on Cr of 0.92).  Assessment:  Heparin level is just above goal on 850 units/hr.  CBC trending down some, platelet count 150->141K->131K.  Cardiac cath planned for Monday 3/18, then decisions on +/- DCCV and further anticoag plans.   Goal of Therapy:  Heparin level 0.3-0.7 units/ml   Plan:    Will decrease rate from 850 to 750 units/hr.   Next level and CBC in AM.     Will continue to watch platelet count.   Dennie Fetters, RPh Pager: [phone] 08/12/2011,9:19 AM

## 2011-08-12 NOTE — Progress Notes (Signed)
Pt had c/o of bloating,Simethicone 80 mg given. Bloating relieved.  Ancil Linsey RN

## 2011-08-13 ENCOUNTER — Encounter (HOSPITAL_COMMUNITY)
Admission: AD | Disposition: A | Discharge status: Home / Self Care | Payer: Self-pay | Source: Ambulatory Visit | Attending: Cardiovascular Disease

## 2011-08-13 HISTORY — PX: CARDIAC CATHETERIZATION: SHX172

## 2011-08-13 HISTORY — PX: LEFT HEART CATHETERIZATION WITH CORONARY ANGIOGRAM: SHX5451

## 2011-08-13 LAB — HEPARIN LEVEL (UNFRACTIONATED): Heparin Unfractionated: 0.43 IU/mL (ref 0.30–0.70)

## 2011-08-13 LAB — CBC
HCT: 33.9 % — ABNORMAL LOW (ref 39.0–52.0)
MCHC: 33.6 g/dL (ref 30.0–36.0)
Platelets: 128 10*3/uL — ABNORMAL LOW (ref 150–400)
RDW: 13.6 % (ref 11.5–15.5)
WBC: 5 10*3/uL (ref 4.0–10.5)

## 2011-08-13 LAB — POCT ACTIVATED CLOTTING TIME: Activated Clotting Time: 133 seconds

## 2011-08-13 SURGERY — LEFT HEART CATHETERIZATION WITH CORONARY ANGIOGRAM
Anesthesia: LOCAL

## 2011-08-13 MED ORDER — FENTANYL CITRATE 0.05 MG/ML IJ SOLN
INTRAMUSCULAR | Status: AC
  Filled 2011-08-13: qty 2

## 2011-08-13 MED ORDER — HEPARIN (PORCINE) IN NACL 2-0.9 UNIT/ML-% IJ SOLN
INTRAMUSCULAR | Status: AC
  Filled 2011-08-13: qty 2000

## 2011-08-13 MED ORDER — ACETAMINOPHEN 325 MG PO TABS: 650.0000 mg | ORAL_TABLET | ORAL | Status: DC | PRN

## 2011-08-13 MED ORDER — ISOSORBIDE MONONITRATE 15 MG HALF TABLET
15.0000 mg | ORAL_TABLET | Freq: Every day | ORAL | Status: DC
  Administered 2011-08-13 – 2011-08-16 (×4): 15 mg via ORAL
  Filled 2011-08-13 (×5): qty 1

## 2011-08-13 MED ORDER — LIDOCAINE HCL (PF) 1 % IJ SOLN
INTRAMUSCULAR | Status: AC
  Filled 2011-08-13: qty 30

## 2011-08-13 MED ORDER — SODIUM CHLORIDE 0.9 % IV SOLN
INTRAVENOUS | Status: DC
  Administered 2011-08-13: 15:00:00 via INTRAVENOUS

## 2011-08-13 MED ORDER — OFF THE BEAT BOOK
Freq: Once | Status: AC
  Administered 2011-08-13: 18:00:00
  Filled 2011-08-13: qty 1

## 2011-08-13 MED ORDER — NITROGLYCERIN 0.2 MG/ML ON CALL CATH LAB
INTRAVENOUS | Status: AC
  Filled 2011-08-13: qty 1

## 2011-08-13 MED ORDER — COUMADIN BOOK
Freq: Once | Status: AC
  Administered 2011-08-13: 18:00:00
  Filled 2011-08-13: qty 1

## 2011-08-13 MED ORDER — MIDAZOLAM HCL 2 MG/2ML IJ SOLN
INTRAMUSCULAR | Status: AC
  Filled 2011-08-13: qty 2

## 2011-08-13 MED ORDER — WARFARIN - PHARMACIST DOSING INPATIENT: Freq: Every day | Status: DC

## 2011-08-13 MED ORDER — WARFARIN VIDEO
Freq: Once | Status: AC
  Administered 2011-08-13: 18:00:00

## 2011-08-13 MED ORDER — HEPARIN (PORCINE) IN NACL 100-0.45 UNIT/ML-% IJ SOLN
850.0000 [IU]/h | INTRAMUSCULAR | Status: DC
  Administered 2011-08-13: 750 [IU]/h via INTRAVENOUS
  Administered 2011-08-14: 850 [IU]/h via INTRAVENOUS
  Filled 2011-08-13: qty 250

## 2011-08-13 MED ORDER — WARFARIN SODIUM 5 MG PO TABS
5.0000 mg | ORAL_TABLET | Freq: Once | ORAL | Status: AC
  Administered 2011-08-13: 5 mg via ORAL
  Filled 2011-08-13: qty 1

## 2011-08-13 MED ORDER — ONDANSETRON HCL 4 MG/2ML IJ SOLN: 4.0000 mg | Freq: Four times a day (QID) | INTRAMUSCULAR | Status: DC | PRN

## 2011-08-13 NOTE — Progress Notes (Signed)
ANTICOAGULATION CONSULT NOTE - Initial Consult  Pharmacy Consult for Coumadin Indication: atrial fibrillation  No Known Allergies  Patient Measurements: Height: 5\' 9"  (175.3 cm) Weight: 144 lb 10 oz (65.6 kg) IBW/kg (Calculated) : 70.7   Vital Signs: Temp: 97.8 F (36.6 C) (03/18 0600) BP: 118/69 mmHg (03/18 1214) Pulse Rate: 104  (03/18 0932)  Labs:  Basename 08/13/11 0547 08/12/11 1350 08/12/11 0550 08/11/11 1138 08/11/11 0500 08/11/11 0250 08/10/11 2051  HGB 11.4* 12.1* -- -- -- -- --  HCT 33.9* 35.4* 36.4* -- -- -- --  PLT 128* 132* 131* -- -- -- --  APTT -- -- -- -- -- -- 35  LABPROT -- -- -- -- -- -- 14.9  INR -- -- -- -- -- -- 1.15  HEPARINUNFRC 0.43 -- 0.74* 0.67 -- -- --  CREATININE -- 0.75 -- -- 0.92 -- 1.04  CKTOTAL -- -- -- -- -- 75 83  CKMB -- -- -- -- -- 5.3* 6.3*  TROPONINI -- -- -- -- -- 0.74* 0.72*   Estimated Creatinine Clearance: 53.5 ml/min (by C-G formula based on Cr of 0.75).  Medical History: Past Medical History  Diagnosis Date  . Myocardial infarct   . Coronary artery disease     Assessment: 78 yo M with new afib to begin Coumadin.  Also on heparin drip.  Baseline INR 1.15.    Goal of Therapy:  INR 2-3   Plan:  1.  Coumadin 5 mg po x 1 tonight 2.  Daily PT/INR 3.  Coumadin video and book ordered for patient  Rolland Porter, Pharm.D., BCPS Clinical Pharmacist Pager: [phone]

## 2011-08-13 NOTE — Progress Notes (Signed)
ANTICOAGULATION CONSULT NOTE - Follow Up Consult  Pharmacy Consult for  Heparin Indication: atrial fibrillation  No Known Allergies  Patient Measurements: Height: 5\' 9"  (175.3 cm) Weight: 144 lb 10 oz (65.6 kg) IBW/kg (Calculated) : 70.7  Heparin Dosing Weight: 65 kg  Vital Signs: Temp: 97.8 F (36.6 C) (03/18 0600) BP: 135/70 mmHg (03/18 0600) Pulse Rate: 86  (03/18 0600)  Labs:  Basename 08/13/11 0547 08/12/11 1350 08/12/11 0550 08/11/11 1138 08/11/11 0500 08/11/11 0250 08/10/11 2051  HGB 11.4* 12.1* -- -- -- -- --  HCT 33.9* 35.4* 36.4* -- -- -- --  PLT 128* 132* 131* -- -- -- --  APTT -- -- -- -- -- -- 35  LABPROT -- -- -- -- -- -- 14.9  INR -- -- -- -- -- -- 1.15  HEPARINUNFRC 0.43 -- 0.74* 0.67 -- -- --  CREATININE -- 0.75 -- -- 0.92 -- 1.04  CKTOTAL -- -- -- -- -- 75 83  CKMB -- -- -- -- -- 5.3* 6.3*  TROPONINI -- -- -- -- -- 0.74* 0.72*   Estimated Creatinine Clearance: 53.5 ml/min (by C-G formula based on Cr of 0.75).  Assessment:78 yr old male admitted after his MD noted AF on EKG and troponin elevated at 0.55.  Anticoagulation - Heparin for afib. Level 0.43 today. Planning cath 3/18, then decide +/- DCCV and further need for anticoag.   Cardiovascular -known CAD, hx multiple PCIs, HTN, ICM, has pacemaker.Troponin up some. Demand ischemia vs tiny NSTEMI? New afib.  Meds: ASA 81mg , Plavix, digoxin, diltiazem, metoprolol  Endocrinology - not diabetic   Gastrointestinal / Nutrition - no issues  Neurology - poor short-term memory. On Aricept 10 mg qhs as pta  Nephrology - Scr 0.75  Pulmonary - 99% on RA  Hematology / Oncology - H/H down a little but still ok. Low PLTC (150->141K->131>128) - watch  PTA Medication Issues - off home Lomotil qid (good!), also off home Lasix, ok.  Best Practices - IV heparin, no GI px (ok)   Goal of Therapy:  Heparin level 0.3-0.7 units/ml   Plan:  Heparin 750 units/hr Will f/u after cath today for anticoag  plans.  Pasty Spillers, PharmD 08/13/2011,9:12 AM

## 2011-08-13 NOTE — Brief Op Note (Signed)
08/10/2011 - 08/13/2011  10:34 AM  PATIENT:  Johnathan [name]  78 y.o. male  PRE-OPERATIVE DIAGNOSIS:  Mildly positive troponin, abnormal myoview  Full note dictated; see diagram in chart   DICTATION # [id number], [id number]  Tolerated well;  Medical therapy.  Lennette Bihari, MD, [facility] 08/13/2011 10:38 AM

## 2011-08-13 NOTE — Progress Notes (Signed)
UR Completed. Simmons, Rana Hochstein F [phone]  

## 2011-08-13 NOTE — Progress Notes (Signed)
ANTICOAGULATION CONSULT NOTE - Follow Up Consult  Pharmacy Consult for  Heparin Indication: atrial fibrillation  No Known Allergies  Patient Measurements: Height: 5\' 9"  (175.3 cm) Weight: 144 lb 10 oz (65.6 kg) IBW/kg (Calculated) : 70.7  Heparin Dosing Weight: 65 kg  Vital Signs: Temp: 97.8 F (36.6 C) (03/18 0600) BP: 118/69 mmHg (03/18 1214) Pulse Rate: 104  (03/18 0932)  Labs:  Basename 08/13/11 0547 08/12/11 1350 08/12/11 0550 08/11/11 1138 08/11/11 0500 08/11/11 0250 08/10/11 2051  HGB 11.4* 12.1* -- -- -- -- --  HCT 33.9* 35.4* 36.4* -- -- -- --  PLT 128* 132* 131* -- -- -- --  APTT -- -- -- -- -- -- 35  LABPROT -- -- -- -- -- -- 14.9  INR -- -- -- -- -- -- 1.15  HEPARINUNFRC 0.43 -- 0.74* 0.67 -- -- --  CREATININE -- 0.75 -- -- 0.92 -- 1.04  CKTOTAL -- -- -- -- -- 75 83  CKMB -- -- -- -- -- 5.3* 6.3*  TROPONINI -- -- -- -- -- 0.74* 0.72*   Estimated Creatinine Clearance: 53.5 ml/min (by C-G formula based on Cr of 0.75).  Assessment:78 yr old male admitted after his MD noted AF on EKG and troponin elevated at 0.55.  Anticoagulation - Heparin for afib. Level 0.43 today. Cath done today notes medical management. Sheath out 1017.  Goal of Therapy:  Heparin level 0.3-0.7 units/ml   Plan: Resume heparin 8 hrs after sheath out (1815) at 750 units/hr. Check next heparin level in am.  Pasty Spillers, PharmD 08/13/2011,12:47 PM

## 2011-08-13 NOTE — Progress Notes (Signed)
[facility] Progress Note  Subjective:  No chest pain; for cath today  Objective:   Vital Signs in the last 24 hours: Temp:  [97.8 F (36.6 C)-98 F (36.7 C)] 97.8 F (36.6 C) (03/18 0600) Pulse Rate:  [74-121] 86  (03/18 0600) Resp:  [16-20] 20  (03/18 0600) BP: (99-135)/(66-77) 135/70 mmHg (03/18 0600) SpO2:  [94 %-99 %] 94 % (03/18 0600)  Intake/Output from previous day: 03/17 0701 - 03/18 0700 In: 671.7 [P.O.:480; I.V.:191.7] Out: 3 [Urine:1; Stool:2]      . aspirin  324 mg Oral Pre-Cath  . aspirin  81 mg Oral Daily  . clopidogrel  75 mg Oral Daily  . digoxin  0.125 mg Oral Daily  . digoxin  0.25 mg Oral Once  . diltiazem  60 mg Oral Q8H  . donepezil  10 mg Oral QHS  . furosemide  80 mg Oral Daily  . metoprolol  125 mg Oral BID  . sodium chloride  3 mL Intravenous Q12H  . sodium chloride  3 mL Intravenous Q12H  . DISCONTD: aspirin  324 mg Oral Pre-Cath  . DISCONTD: digoxin  0.25 mg Oral Daily  . DISCONTD: metoprolol  100 mg Oral BID   Physical Exam:   [place]/AT HEENT without sig abnl No JVD Lungs clear irreg rhythm, paced abd soft, BS + No edema   Rate: 70  Rhythm: paced, underlying AF  Lab Results: Results for orders placed during the hospital encounter of 08/10/11 (from the past 48 hour(s))  HEPARIN LEVEL (UNFRACTIONATED)     Status: Normal   Collection Time   08/11/11 11:38 AM      Component Value Range Comment   Heparin Unfractionated 0.67  0.30 - 0.70 (IU/mL)   HEPARIN LEVEL (UNFRACTIONATED)     Status: Abnormal   Collection Time   08/12/11  5:50 AM      Component Value Range Comment   Heparin Unfractionated 0.74 (*) 0.30 - 0.70 (IU/mL)   CBC     Status: Abnormal   Collection Time   08/12/11  5:50 AM      Component Value Range Comment   WBC 6.1  4.0 - 10.5 (K/uL)    RBC 3.62 (*) 4.22 - 5.81 (MIL/uL)    Hemoglobin 12.2 (*) 13.0 - 17.0 (g/dL)    HCT 16.1 (*) 09.6 - 52.0 (%)    MCV 100.6 (*) 78.0 - 100.0 (fL)    MCH 33.7  26.0 - 34.0 (pg)    MCHC 33.5  30.0 - 36.0 (g/dL)    RDW 04.5  40.9 - 81.1 (%)    Platelets 131 (*) 150 - 400 (K/uL)   BASIC METABOLIC PANEL     Status: Abnormal   Collection Time   08/12/11  1:50 PM      Component Value Range Comment   Sodium 133 (*) 135 - 145 (mEq/L)    Potassium 3.6  3.5 - 5.1 (mEq/L)    Chloride 97  96 - 112 (mEq/L)    CO2 28  19 - 32 (mEq/L)    Glucose, Bld 95  70 - 99 (mg/dL)    BUN 16  6 - 23 (mg/dL)    Creatinine, Ser 9.14  0.50 - 1.35 (mg/dL)    Calcium 8.6  8.4 - 10.5 (mg/dL)    GFR calc non Af Amer 77 (*) >90 (mL/min)    GFR calc Af Amer 89 (*) >90 (mL/min)   CBC     Status:  Abnormal   Collection Time   08/12/11  1:50 PM      Component Value Range Comment   WBC 5.4  4.0 - 10.5 (K/uL)    RBC 3.51 (*) 4.22 - 5.81 (MIL/uL)    Hemoglobin 12.1 (*) 13.0 - 17.0 (g/dL)    HCT 32.4 (*) 40.1 - 52.0 (%)    MCV 100.9 (*) 78.0 - 100.0 (fL)    MCH 34.5 (*) 26.0 - 34.0 (pg)    MCHC 34.2  30.0 - 36.0 (g/dL)    RDW 02.7  25.3 - 66.4 (%)    Platelets 132 (*) 150 - 400 (K/uL)   HEPARIN LEVEL (UNFRACTIONATED)     Status: Normal   Collection Time   08/13/11  5:47 AM      Component Value Range Comment   Heparin Unfractionated 0.43  0.30 - 0.70 (IU/mL)   CBC     Status: Abnormal   Collection Time   08/13/11  5:47 AM      Component Value Range Comment   WBC 5.0  4.0 - 10.5 (K/uL)    RBC 3.40 (*) 4.22 - 5.81 (MIL/uL)    Hemoglobin 11.4 (*) 13.0 - 17.0 (g/dL)    HCT 40.3 (*) 47.4 - 52.0 (%)    MCV 99.7  78.0 - 100.0 (fL)    MCH 33.5  26.0 - 34.0 (pg)    MCHC 33.6  30.0 - 36.0 (g/dL)    RDW 25.9  56.3 - 87.5 (%)    Platelets 128 (*) 150 - 400 (K/uL)      Basename 08/12/11 1350 08/11/11 0500  NA 133* 135  K 3.6 3.7  CL 97 97  CO2 28 24  GLUCOSE 95 99  BUN 16 18  CREATININE 0.75 0.92    Basename 08/11/11 0250 08/10/11 2051  TROPONINI 0.74* 0.72*   Hepatic Function Panel  Basename 08/10/11 2051  PROT 6.5  ALBUMIN 4.2  AST 31  ALT 19  ALKPHOS  50  BILITOT 0.6  BILIDIR --  IBILI --    Basename 08/10/11 2051  INR 1.15    Lipid Panel  No results found for this basename: chol, trig, hdl, cholhdl, vldl, ldlcalc     Imaging:  No results found.    Assessment/Plan:   Principal Problem:  *Atrial fibrillation Active Problems:  Troponin level elevated, 0.55  CAD (coronary artery disease), multiple PCI, abn Myoview 1/13  Ischemic cardiomyopathy, 40-45%  HTN (hypertension)  Pacemaker   Discussed cath with pt and family.  Plan today.   Lennette Bihari, MD, [facility] 08/13/2011, 8:51 AM

## 2011-08-14 ENCOUNTER — Encounter (HOSPITAL_COMMUNITY): Payer: Self-pay | Admitting: Cardiology

## 2011-08-14 DIAGNOSIS — N400 Enlarged prostate without lower urinary tract symptoms: Secondary | ICD-10-CM | POA: Insufficient documentation

## 2011-08-14 LAB — CBC
MCH: 33.9 pg (ref 26.0–34.0)
MCV: 100.6 fL — ABNORMAL HIGH (ref 78.0–100.0)
Platelets: 123 10*3/uL — ABNORMAL LOW (ref 150–400)
RBC: 3.45 MIL/uL — ABNORMAL LOW (ref 4.22–5.81)
RDW: 13.8 % (ref 11.5–15.5)
WBC: 4.1 10*3/uL (ref 4.0–10.5)

## 2011-08-14 LAB — PROTIME-INR
INR: 1.1 (ref 0.00–1.49)
Prothrombin Time: 14.4 seconds (ref 11.6–15.2)

## 2011-08-14 SURGERY — Surgical Case
Anesthesia: *Unknown

## 2011-08-14 MED ORDER — SODIUM CHLORIDE 0.45 % IV SOLN
INTRAVENOUS | Status: DC
  Administered 2011-08-15: 500 mL via INTRAVENOUS

## 2011-08-14 MED ORDER — DILTIAZEM HCL ER COATED BEADS 120 MG PO TB24
120.0000 mg | ORAL_TABLET | Freq: Every day | ORAL | Status: DC
  Administered 2011-08-14 – 2011-08-16 (×3): 120 mg via ORAL
  Filled 2011-08-14 (×3): qty 1

## 2011-08-14 MED ORDER — SODIUM CHLORIDE 0.9 % IJ SOLN: 3.0000 mL | INTRAMUSCULAR | Status: DC | PRN

## 2011-08-14 MED ORDER — RIVAROXABAN 10 MG PO TABS
20.0000 mg | ORAL_TABLET | Freq: Every day | ORAL | Status: DC
  Filled 2011-08-14: qty 2

## 2011-08-14 MED ORDER — POTASSIUM CHLORIDE CRYS ER 20 MEQ PO TBCR
20.0000 meq | EXTENDED_RELEASE_TABLET | Freq: Once | ORAL | Status: AC
  Administered 2011-08-14: 20 meq via ORAL
  Filled 2011-08-14: qty 1

## 2011-08-14 MED ORDER — SODIUM CHLORIDE 0.9 % IJ SOLN
3.0000 mL | Freq: Two times a day (BID) | INTRAMUSCULAR | Status: DC
  Administered 2011-08-14 – 2011-08-15 (×2): 3 mL via INTRAVENOUS

## 2011-08-14 MED ORDER — RIVAROXABAN 10 MG PO TABS
20.0000 mg | ORAL_TABLET | Freq: Every day | ORAL | Status: DC
  Administered 2011-08-14 – 2011-08-15 (×2): 20 mg via ORAL
  Filled 2011-08-14 (×3): qty 2

## 2011-08-14 MED ORDER — WARFARIN SODIUM 5 MG PO TABS
5.0000 mg | ORAL_TABLET | Freq: Once | ORAL | Status: DC
  Filled 2011-08-14: qty 1

## 2011-08-14 MED ORDER — SODIUM CHLORIDE 0.9 % IV SOLN: 250.0000 mL | INTRAVENOUS | Status: DC | PRN

## 2011-08-14 NOTE — Cardiovascular Report (Signed)
NAMEKAMIN, NIBLACK NO.:  [id number]  MEDICAL RECORD NO.:  [id number]  LOCATION:  3708                         FACILITY:  [facility]  PHYSICIAN:  Nicki Guadalajara, M.D.     DATE OF BIRTH:  22-Oct-1915  DATE OF PROCEDURE:  08/13/2011 DATE OF DISCHARGE:                           CARDIAC CATHETERIZATION   INDICATIONS:  Mr. Cleotha Tsang is a 78 year old gentleman who has established coronary artery disease and is status post prior stenting to his proximal LAD.  The patient is status post permanent pacemaker insertion.  Apparently, the nuclear scan done in January 2003 did suggest some LAD territory ischemia in addition to old MI/scar.  The patient was recently admitted to [facility] with atrial fibrillation with rapid ventricular response.  CPK was 83 with an MB of 6.3, troponin 0.72.  He now is referred for definitive diagnostic cardiac catheterization.  PROCEDURE:  After premedication with Versed 1 mg plus fentanyl 25 mcg, the patient was prepped and draped in usual fashion.  His right femoral artery was punctured anteriorly, and due to significant tortuosity in navigating the wire up the iliac system, a 25 cm long 5-French sheath was used for the procedure.  Diagnostic catheterization was done utilizing 5-French Judkins 4 left and right coronary catheters.  200 mcg of intracoronary nitroglycerin was selectively administered into the left coronary circulation.  A pigtail catheter was used for RAO ventriculography.  Due to the significant tortuosity, distal aortography was also done to make certain, there was no significant aneurysm.  He tolerated the procedure well, returned to his room in satisfactory condition.  Hemostasis obtained by direct manual pressure.  HEMODYNAMIC DATA:  Central aortic pressure 100/52, left ventricle pressure 100/12, post A-wave 16.  ANGIOGRAPHIC DATA:  There was significant coronary calcification involving the left main, LAD  system.  The left main was otherwise angiographically normal and gave rise to an LAD and circumflex system.  The LAD gave rise to a very high diagonal-like vessel.  Just after this, takeoff was mild 30% narrowing just proximal to the previously placed proximal LAD stent.  The stent was widely patent, and the first diagonal and septal perforating artery arose from the stented segment.  The stent in the mid LAD just proximal to the takeoff of a long second diagonal branch.  The second diagonal branch was diffusely diseased and had long 90% stenosis in small-caliber vessel, and after its bifurcation had 70- 90% mid stenosis.  The LAD was calcified, and there appeared to be 70% mid stenosis that was calcified and eccentric.  The LAD wrapped around the LV apex.  The intermediate vessel was a high optional diagonal vessel that had 30% proximal narrowing.  The circumflex vessel was angiographically normal.  The above lesions were not significantly changed following 200 mcg of intracoronary nitroglycerin administration although the vessels did appear larger but the stenoses remained.  The right coronary artery was a large caliber dominant vessel that had mild smooth 20% mid narrowing.  The vessel ended in a large PDA, bifurcating inferior LV branch, and moderate sized posterolateral vessel.  There was 50% narrowing in the posterolateral vessel.  RAO ventriculography showed an ejection fraction of approximately  45%. The distal anterolateral wall extending around the apex was severely hypo to akinetic concordant with old scar.  There did appear to be a focal region of apical dyskinesis.  Distal aortography revealed widely patent renal arteries.  There was irregularity of the infrarenal aorta.  There was significant tortuosity to the iliac system without significant stenosis or aneurysmal dilatation.  IMPRESSION: 1. Mild left ventricular dysfunction with severe hypo to akinesis of      the distal anterolateral wall extending around the apex with focal     apical dyskinesis. 2. Significant coronary artery disease with coronary calcification     with evidence for 30% very proximal LAD stenosis prior to the     stented segment with a widely patent proximal LAD stent, diffuse     disease in a small-caliber but long second diagonal vessel with 90%     diffuse proximal stenosis and 70- 90% mid stenosis, 70% stenosis,     and calcified eccentric mid LAD segment, not significantly improved     following IC nitroglycerin administration; 30% optional diagonal     stenosis.  Normal left circumflex coronary artery.  Dominant right coronary artery:  Dominant large right coronary artery with 20% mid stenosis and 50% posterolateral stenosis.  The aortoiliac disease with luminal irregularities of the distal aorta and tortuosity of the iliac system without frank aneurysmal dilatation or high-grade stenosis.  RECOMMENDATION:  Medical therapy.          ______________________________ Nicki Guadalajara, M.D.     TK/MEDQ  D:  08/13/2011  T:  08/13/2011  Job:  [id number]  cc:   Juline Patch, M.D. Thurmon Fair, MD

## 2011-08-14 NOTE — Progress Notes (Signed)
Subjective:  No chest pain  Objective:  Vital Signs in the last 24 hours: Temp:  [97.3 F (36.3 C)-98.6 F (37 C)] 97.3 F (36.3 C) (03/19 0552) Pulse Rate:  [82-103] 91  (03/19 0552) Resp:  [18-20] 20  (03/19 0552) BP: (118-126)/(63-74) 124/63 mmHg (03/19 0552) SpO2:  [95 %-98 %] 97 % (03/19 0552) Weight:  [61.871 kg (136 lb 6.4 oz)] 61.871 kg (136 lb 6.4 oz) (03/19 0552)  Intake/Output from previous day:  Intake/Output Summary (Last 24 hours) at 08/14/11 0944 Last data filed at 08/14/11 0825  Gross per 24 hour  Intake   1820 ml  Output    600 ml  Net   1220 ml    Physical Exam: General appearance: alert, cooperative and no distress Lungs: decreased breath sounds Heart: regular rate and rhythm Rt groin without hematoma   Rate: 90  Rhythm: atrial fibrillation  Lab Results:  Basename 08/14/11 0500 08/13/11 0547  WBC 4.1 5.0  HGB 11.7* 11.4*  PLT 123* 128*    Basename 08/12/11 1350  NA 133*  K 3.6  CL 97  CO2 28  GLUCOSE 95  BUN 16  CREATININE 0.75   No results found for this basename: TROPONINI:2,CK,MB:2 in the last 72 hours Hepatic Function Panel No results found for this basename: PROT,ALBUMIN,AST,ALT,ALKPHOS,BILITOT,BILIDIR,IBILI in the last 72 hours No results found for this basename: CHOL in the last 72 hours  Basename 08/14/11 0500  INR 1.10    Imaging: No results found.  Cardiac Studies:  Assessment/Plan:   Principal Problem:  *Atrial fibrillation, with RVR on admission  Active Problems:  Troponin level elevated, 0.74  CAD, LAD stent 2003, 2006, patent with distal disease this admission, plan medical RX  Ischemic cardiomyopathy, 40-45%  HTN (hypertension)  Pacemaker, St Jude 2006  Plan- I reviewed office records and prior pacer interrogations with pacemaker rep. We are unable to tell if the patient has been is AF previllously, (usually A-V paced). Will discuss with Dr Berry. Home on Xarelto in am? Will change Diltiazem to CD and  ambulate today.  Luke Kilroy PA-C 08/14/2011, 9:44 AM  I have seen and examined the patient along with Luke Kilroy PA-C.  I have reviewed the chart, notes and new data.  I agree with PA's note.  Key new complaints: no dyspnea  Key examination changes: fair rate control ; note net positive 1.2 L last 24h  PLAN: Unable to do TEE CV today as patient had breakfast; rescheduled for AM. DC on Xarelto whether or not he can undergo CV tomorrow.  Norely Schlick, MD, FACC [facility] [phone] 08/14/2011, 6:50 PM   

## 2011-08-14 NOTE — Progress Notes (Signed)
ANTICOAGULATION CONSULT NOTE - Follow Up Consult  Pharmacy Consult for heparin Indication: atrial fibrillation  Labs:  Basename 08/14/11 0500 08/13/11 0547 08/12/11 1350 08/12/11 0550  HGB 11.7* 11.4* -- --  HCT 34.7* 33.9* 35.4* --  PLT 123* 128* 132* --  APTT -- -- -- --  LABPROT 14.4 -- -- --  INR 1.10 -- -- --  HEPARINUNFRC 0.23* 0.43 -- 0.74*  CREATININE -- -- 0.75 --  CKTOTAL -- -- -- --  CKMB -- -- -- --  TROPONINI -- -- -- --    Assessment: 78yo male now subtherapeutic on heparin after therapeutic at current rate for Afib.  Goal of Therapy:  Heparin level 0.3-0.7 units/ml   Plan:  Will increase heparin gtt by 1-2 units/kg/hr to 850 units/hr and check level in 8hr.  Colleen Can PharmD BCPS 08/14/2011,7:19 AM

## 2011-08-14 NOTE — Progress Notes (Signed)
ANTICOAGULATION CONSULT NOTE - Follow Up Consult  Pharmacy Consult for Heparin/Coumadin Indication: atrial fibrillation  No Known Allergies  Patient Measurements: Height: 5\' 9"  (175.3 cm) Weight: 136 lb 6.4 oz (61.871 kg) IBW/kg (Calculated) : 70.7  Heparin Dosing Weight: 62 kg  Vital Signs: Temp: 97.3 F (36.3 C) (03/19 0552) Temp src: Oral (03/19 0552) BP: 124/63 mmHg (03/19 0552) Pulse Rate: 91  (03/19 0552)  Labs:  Basename 08/14/11 0500 08/13/11 0547 08/12/11 1350 08/12/11 0550  HGB 11.7* 11.4* -- --  HCT 34.7* 33.9* 35.4* --  PLT 123* 128* 132* --  APTT -- -- -- --  LABPROT 14.4 -- -- --  INR 1.10 -- -- --  HEPARINUNFRC 0.23* 0.43 -- 0.74*  CREATININE -- -- 0.75 --  CKTOTAL -- -- -- --  CKMB -- -- -- --  TROPONINI -- -- -- --   Estimated Creatinine Clearance: 50.5 ml/min (by C-G formula based on Cr of 0.75).  Assessment:78 yr old male admitted after his MD noted AF on EKG and troponin elevated at 0.55.  Anticoagulation - Heparin for afib. Level 0.23. Coumadin started last pm. INR 1.1.Hgb stable 11.7. Platelets stable 123.  Cardiovascular -known CAD, hx multiple PCIs, HTN, ICM, has pacemaker.Troponin up some. Demand ischemia vs tiny NSTEMI? New afib.  Meds: ASA 81mg , Plavix, digoxin, diltiazem, Imdur, metoprolol  Endocrinology - not diabetic   Gastrointestinal / Nutrition - no issues  Neurology - poor short-term memory. On Aricept 10 mg qhs.  Nephrology - Scr 0.75  Pulmonary - 99% on RA  Hematology / Oncology - CBC stable  Goal of Therapy:  Heparin level 0.3-0.7 units/ml INR 2-3   Plan:  Heparin 850 units/hr. Level at 1500 today. Coumadin 5mg  po x 1 again today.  Pasty Spillers, PharmD 08/14/2011,8:17 AM

## 2011-08-15 ENCOUNTER — Encounter (HOSPITAL_COMMUNITY)
Admission: AD | Disposition: A | Discharge status: Home / Self Care | Payer: Self-pay | Source: Ambulatory Visit | Attending: Cardiovascular Disease

## 2011-08-15 ENCOUNTER — Inpatient Hospital Stay (HOSPITAL_COMMUNITY): Payer: Medicare Other | Admitting: Certified Registered"

## 2011-08-15 ENCOUNTER — Encounter (HOSPITAL_COMMUNITY): Payer: Self-pay | Admitting: Gastroenterology

## 2011-08-15 ENCOUNTER — Encounter (HOSPITAL_COMMUNITY): Payer: Self-pay | Admitting: Certified Registered"

## 2011-08-15 HISTORY — PX: TEE WITHOUT CARDIOVERSION: SHX5443

## 2011-08-15 HISTORY — PX: CARDIOVERSION: SHX1299

## 2011-08-15 LAB — CBC
Hemoglobin: 11.7 g/dL — ABNORMAL LOW (ref 13.0–17.0)
Platelets: 126 10*3/uL — ABNORMAL LOW (ref 150–400)
RBC: 3.44 MIL/uL — ABNORMAL LOW (ref 4.22–5.81)
WBC: 3.8 10*3/uL — ABNORMAL LOW (ref 4.0–10.5)

## 2011-08-15 LAB — BASIC METABOLIC PANEL
BUN: 13 mg/dL (ref 6–23)
CO2: 25 mEq/L (ref 19–32)
Calcium: 8.6 mg/dL (ref 8.4–10.5)
Chloride: 103 mEq/L (ref 96–112)
Creatinine, Ser: 0.78 mg/dL (ref 0.50–1.35)
GFR calc Af Amer: 87 mL/min — ABNORMAL LOW (ref >90)
GFR calc non Af Amer: 75 mL/min — ABNORMAL LOW (ref >90)
Glucose, Bld: 94 mg/dL (ref 70–99)
Potassium: 3.4 mEq/L — ABNORMAL LOW (ref 3.5–5.1)
Sodium: 139 mEq/L (ref 135–145)

## 2011-08-15 SURGERY — Surgical Case
Anesthesia: *Unknown

## 2011-08-15 SURGERY — CARDIOVERSION
Anesthesia: Monitor Anesthesia Care

## 2011-08-15 SURGERY — ECHOCARDIOGRAM, TRANSESOPHAGEAL
Anesthesia: Moderate Sedation

## 2011-08-15 MED ORDER — AMIODARONE HCL 200 MG PO TABS
400.0000 mg | ORAL_TABLET | Freq: Three times a day (TID) | ORAL | Status: DC
  Administered 2011-08-15 – 2011-08-16 (×3): 400 mg via ORAL
  Filled 2011-08-15 (×5): qty 2

## 2011-08-15 MED ORDER — MIDAZOLAM HCL 10 MG/2ML IJ SOLN
INTRAMUSCULAR | Status: AC
  Filled 2011-08-15: qty 2

## 2011-08-15 MED ORDER — FENTANYL CITRATE 0.05 MG/ML IJ SOLN
INTRAMUSCULAR | Status: DC | PRN
  Administered 2011-08-15: 25 ug via INTRAVENOUS

## 2011-08-15 MED ORDER — LIDOCAINE VISCOUS 2 % MT SOLN
OROMUCOSAL | Status: DC | PRN
  Administered 2011-08-15: 10 mL via OROMUCOSAL

## 2011-08-15 MED ORDER — FENTANYL CITRATE 0.05 MG/ML IJ SOLN
INTRAMUSCULAR | Status: AC
  Filled 2011-08-15: qty 2

## 2011-08-15 MED ORDER — BUTAMBEN-TETRACAINE-BENZOCAINE 2-2-14 % EX AERO
INHALATION_SPRAY | CUTANEOUS | Status: DC | PRN
  Administered 2011-08-15: 1 via TOPICAL

## 2011-08-15 MED ORDER — MIDAZOLAM HCL 10 MG/2ML IJ SOLN
INTRAMUSCULAR | Status: DC | PRN
  Administered 2011-08-15 (×2): 1 mg via INTRAVENOUS

## 2011-08-15 MED ORDER — LIDOCAINE VISCOUS 2 % MT SOLN
OROMUCOSAL | Status: AC
  Filled 2011-08-15: qty 15

## 2011-08-15 NOTE — Progress Notes (Signed)
*  PRELIMINARY RESULTS* Echocardiogram Echocardiogram Transesophageal has been performed.  Glean Salen [facility] 08/15/2011, 12:44 PM

## 2011-08-15 NOTE — Anesthesia Postprocedure Evaluation (Signed)
  Anesthesia Post-op Note  Patient: Johnathan [name]  Procedure(s) Performed: Procedure(s) (LRB): TRANSESOPHAGEAL ECHOCARDIOGRAM (TEE) (N/A) CARDIOVERSION (N/A)  Patient Location: PACU and Endoscopy Unit  Anesthesia Type: MAC  Level of Consciousness: awake  Airway and Oxygen Therapy: Patient Spontanous Breathing and Patient connected to nasal cannula oxygen  Post-op Pain: none  Post-op Assessment: Post-op Vital signs reviewed and Patient's Cardiovascular Status Stable  Post-op Vital Signs: Reviewed and stable  Complications: No apparent anesthesia complications

## 2011-08-15 NOTE — Progress Notes (Addendum)
   CARE MANAGEMENT NOTE 08/15/2011  Patient:  Johnathan [name], Johnathan [name]   Account Number:  [id number]  Date Initiated:  08/15/2011  Documentation initiated by:  GRAVES-BIGELOW,Khyle Goodell  Subjective/Objective Assessment:   Pt admitted with a fib. Plan for TEE today.      Action/Plan:   CM will continue to monitor for disposition needs.   Anticipated DC Date:  08/15/2011   Anticipated DC Plan:  HOME W HOME HEALTH SERVICES      DC Planning Services  CM consult      Choice offered to / List presented to:             Status of service:  In process, will continue to follow Medicare Important Message given?   (If response is "NO", the following Medicare IM given date fields will be blank) Date Medicare IM given:   Date Additional Medicare IM given:    Discharge Disposition:    Per UR Regulation:    If discussed at Long Length of Stay Meetings, dates discussed:    Comments:  08-15-11 1245 Tomi Bamberger, RN,BSN [phone] CM will continue to monitor for disposition needs.

## 2011-08-15 NOTE — Anesthesia Postprocedure Evaluation (Signed)
See paper record

## 2011-08-15 NOTE — Transfer of Care (Signed)
Immediate Anesthesia Transfer of Care Note  Patient: Johnathan [name]  Procedure(s) Performed: Procedure(s) (LRB): TRANSESOPHAGEAL ECHOCARDIOGRAM (TEE) (N/A) CARDIOVERSION (N/A)  Patient Location: PACU and Endoscopy Unit  Anesthesia Type: MAC  Level of Consciousness: awake  Airway & Oxygen Therapy: Patient Spontanous Breathing and Patient connected to nasal cannula oxygen  Post-op Assessment: Report given to PACU RN and Post -op Vital signs reviewed and stable  Post vital signs: Reviewed and stable  Complications: No apparent anesthesia complications

## 2011-08-15 NOTE — Op Note (Signed)
. [facility]  TEE/CARDIOVERSION NOTE   TRANSESOPHAGEAL ECHOCARDIOGRAM (TEE):  Indictation: Atrial Fibrillation  Consent:   Informed consent was obtained prior to the procedure. The risks, benefits and alternatives for the procedure were discussed and the patient comprehended these risks.  Risks include, but are not limited to, cough, sore throat, vomiting, nausea, somnolence, esophageal and stomach trauma or perforation, bleeding, low blood pressure, aspiration, pneumonia, infection, trauma to the teeth and death.    Time Out: Verified patient identification, verified procedure, site/side was marked, verified correct patient position, special equipment/implants available, medications/allergies/relevent history reviewed, required imaging and test results available. Performed  Procedure:  After a procedural time-out, the patient was given 2 mg versed and25 mcg fentanyl for moderate sedation.  The oropharynx was anesthetized 10 cc of topical 1% viscous lidocaine and 1 cetacaine spray.  The transesophageal probe was inserted in the esophagus and stomach without difficulty and multiple views were obtained.  The patient was kept under observation until the patient left the procedure room.  The patient left the procedure room in stable condition.   Agitated microbubble saline contrast was not administered.  Complications:    Complications: None Patient did tolerate procedure well.  Findings:  1. LEFT VENTRICLE: The left ventricle is mild to moderately hypokinetic, EF ~45%. There is anteroapical hypokinesis.  2. RIGHT VENTRICLE:  The right ventricle is normal in structure and function without any thrombus or masses.    3. LEFT ATRIUM:  The left atrium is dilated without any thrombus or masses. No smoke was noted.  4. LEFT ATRIAL APPENDAGE:  The left atrial appendage is free of any thrombus or masses. Left atrial size is moderate with a partially biphid  appendage.  5. ATRIAL SEPTUM:  The atrial septum is free of any thrombus or masses.  There is bowing of the interatrial septum from left to right. There is no evidence for interatrial shunting by color doppler and saline microbubble.  6. RIGHT ATRIUM:  The right atrium is normal in size and function without any thrombus or masses. Pacemaker leads are seen.  7. MITRAL VALVE:  The mitral valve is normal in structure and function without mild central regurgitation.  There were no vegetations or stenosis.  8. AORTIC VALVE:  The aortic valve is calcified with normal leaflet mobility. There is mild central regurgitation and trace eccentric regurgitation.  There were no vegetations or stenosis  9. TRICUSPID VALVE:  The tricuspid valve is normal in structure and function. There is mild to moderate regurgitation.  There were no vegetations or stenosis. Pacemaker leads were seen crossing the valve.  10.  PULMONIC VALVE:  The tricuspid valve is normal in structure and function without trace to mild regurgitation.  There were no vegetations or stenosis.   11. AORTIC ARCH, ASCENDING AND DESCENDING AORTA:  There was a small amount of Grade 1 atherosclerosis of the proximal descending aorta.  CARDIOVERSION:     Time Out: Verified patient identification, verified procedure, site/side was marked, verified correct patient position, special equipment/implants available, medications/allergies/relevent history reviewed, required imaging and test results available.  Performed  Procedure:  1. Patient placed on cardiac monitor, pulse oximetry, supplemental oxygen as necessary. Due to numerous PVC's and paced complexes, the pacemaker was interrogated and the base rate was set to 50. 2. Sedation administered per anesthesia 3. Pacer pads placed anterior and posterior chest. 4. Cardioverted 2 time(s).  5. Cardioverted at 150J -> remains in a-fib. Then cardioverted at 200 J ->  converted to 3:1 atrial flutter. 6. An  attempt at atrial overdrive pacing was made at 161 msec and 200 msec cycle lengths for 10 seconds each.  Complications:  Complications: None Patient did tolerate procedure well.  Impression:  1. No evidence of LAA thrombus. 2. Unsuccessful cardioversion and atrial overdrive pacing.  Recommendations:  1. Recommend starting antiarrythmic therapy and continuing xarelto.   Time Spent Directly with the Patient:  60 minutes   [name], MD, [facility] Attending Cardiologist [facility]  08/15/2011, 8:37 AM

## 2011-08-15 NOTE — Interval H&P Note (Signed)
History and Physical Interval Note:  08/15/2011 8:35 AM  Johnathan [name]  has presented today for surgery, with the diagnosis of a-fib  The various methods of treatment have been discussed with the patient and family. After consideration of risks, benefits and other options for treatment, the patient has consented to  Procedure(s) (LRB): TRANSESOPHAGEAL ECHOCARDIOGRAM (TEE) (N/A) CARDIOVERSION (N/A) as a surgical intervention .  The patients' history has been reviewed, patient examined, no change in status, stable for surgery.  I have reviewed the patients' chart and labs.  Questions were answered to the patient's satisfaction.     Tyquan Carmickle C

## 2011-08-15 NOTE — H&P (View-Only) (Signed)
Subjective:  No chest pain  Objective:  Vital Signs in the last 24 hours: Temp:  [97.3 F (36.3 C)-98.6 F (37 C)] 97.3 F (36.3 C) (03/19 0552) Pulse Rate:  [82-103] 91  (03/19 0552) Resp:  [18-20] 20  (03/19 0552) BP: (118-126)/(63-74) 124/63 mmHg (03/19 0552) SpO2:  [95 %-98 %] 97 % (03/19 0552) Weight:  [61.871 kg (136 lb 6.4 oz)] 61.871 kg (136 lb 6.4 oz) (03/19 0552)  Intake/Output from previous day:  Intake/Output Summary (Last 24 hours) at 08/14/11 0944 Last data filed at 08/14/11 0825  Gross per 24 hour  Intake   1820 ml  Output    600 ml  Net   1220 ml    Physical Exam: General appearance: alert, cooperative and no distress Lungs: decreased breath sounds Heart: regular rate and rhythm Rt groin without hematoma   Rate: 90  Rhythm: atrial fibrillation  Lab Results:  Basename 08/14/11 0500 08/13/11 0547  WBC 4.1 5.0  HGB 11.7* 11.4*  PLT 123* 128*    Basename 08/12/11 1350  NA 133*  K 3.6  CL 97  CO2 28  GLUCOSE 95  BUN 16  CREATININE 0.75   No results found for this basename: TROPONINI:2,CK,MB:2 in the last 72 hours Hepatic Function Panel No results found for this basename: PROT,ALBUMIN,AST,ALT,ALKPHOS,BILITOT,BILIDIR,IBILI in the last 72 hours No results found for this basename: CHOL in the last 72 hours  Basename 08/14/11 0500  INR 1.10    Imaging: No results found.  Cardiac Studies:  Assessment/Plan:   Principal Problem:  *Atrial fibrillation, with RVR on admission  Active Problems:  Troponin level elevated, 0.74  CAD, LAD stent 2003, 2006, patent with distal disease this admission, plan medical RX  Ischemic cardiomyopathy, 40-45%  HTN (hypertension)  Pacemaker, St Jude 2006  Plan- I reviewed office records and prior pacer interrogations with pacemaker rep. We are unable to tell if the patient has been is AF previllously, (usually A-V paced). Will discuss with Dr Allyson Sabal. Home on Xarelto in am? Will change Diltiazem to CD and  ambulate today.  Corine Shelter PA-C 08/14/2011, 9:44 AM  I have seen and examined the patient along with Corine Shelter PA-C.  I have reviewed the chart, notes and new data.  I agree with PA's note.  Key new complaints: no dyspnea  Key examination changes: fair rate control ; note net positive 1.2 L last 24h  PLAN: Unable to do TEE CV today as patient had breakfast; rescheduled for AM. DC on Xarelto whether or not he can undergo CV tomorrow.  Thurmon Fair, MD, [facility] [phone] 08/14/2011, 6:50 PM

## 2011-08-15 NOTE — Anesthesia Preprocedure Evaluation (Addendum)
Anesthesia Evaluation  Patient identified by MRN, date of birth, ID band  Reviewed: Allergy & Precautions, H&P , NPO status , Patient's Chart, lab work & pertinent test results, reviewed documented beta blocker date and time   Airway Mallampati: II TM Distance: >3 FB     Dental  (+) Dental Advidsory Given   Pulmonary shortness of breath,          Cardiovascular hypertension, On Medications + CAD and + Past MI + dysrhythmias + pacemaker + Valvular Problems/Murmurs     Neuro/Psych    GI/Hepatic   Endo/Other    Renal/GU      Musculoskeletal   Abdominal   Peds  Hematology   Anesthesia Other Findings   Reproductive/Obstetrics                           Anesthesia Physical Anesthesia Plan  ASA: III  Anesthesia Plan:    Post-op Pain Management:    Induction:   Airway Management Planned:   Additional Equipment:   Intra-op Plan:   Post-operative Plan:   Informed Consent:   Plan Discussed with:   Anesthesia Plan Comments:         Anesthesia Quick Evaluation

## 2011-08-15 NOTE — H&P (Signed)
[facility]          INTERVAL PROCEDURE H&P   History and Physical Interval Note:  08/15/2011 8:35 AM  Johnathan [name] has presented today for their planned procedure. The various methods of treatment have been discussed with the patient and family. After consideration of risks, benefits and other options for treatment, the patient has consented to the procedure.  The patients' outpatient history has been reviewed, patient examined, and no change in status from most recent office note within the past 30 days. I have reviewed the patients' chart and labs and will proceed as planned. Questions were answered to the patient's satisfaction.   [name], MD, [facility] Attending Cardiologist [facility]  Johnathan [name] C 08/15/2011, 8:35 AM

## 2011-08-16 ENCOUNTER — Encounter (HOSPITAL_COMMUNITY): Payer: Self-pay | Admitting: Internal Medicine

## 2011-08-16 LAB — CBC
Hemoglobin: 11.8 g/dL — ABNORMAL LOW (ref 13.0–17.0)
Platelets: 126 10*3/uL — ABNORMAL LOW (ref 150–400)
RBC: 3.46 MIL/uL — ABNORMAL LOW (ref 4.22–5.81)
WBC: 4.4 10*3/uL (ref 4.0–10.5)

## 2011-08-16 MED ORDER — ACETAMINOPHEN 325 MG PO TABS: 650.0000 mg | ORAL_TABLET | ORAL | Status: DC | PRN

## 2011-08-16 MED ORDER — ISOSORBIDE MONONITRATE 15 MG HALF TABLET: 15.0000 mg | ORAL_TABLET | Freq: Every day | ORAL | Status: DC

## 2011-08-16 MED ORDER — DILTIAZEM HCL ER COATED BEADS 120 MG PO TB24: 120.0000 mg | ORAL_TABLET | Freq: Every day | ORAL | Status: DC

## 2011-08-16 MED ORDER — AMIODARONE HCL 400 MG PO TABS: 400.0000 mg | ORAL_TABLET | Freq: Three times a day (TID) | ORAL | Status: DC

## 2011-08-16 MED ORDER — NITROGLYCERIN 0.4 MG SL SUBL: 0.4000 mg | SUBLINGUAL_TABLET | SUBLINGUAL | Status: DC | PRN

## 2011-08-16 MED ORDER — RIVAROXABAN 20 MG PO TABS: 20.0000 mg | ORAL_TABLET | Freq: Every day | ORAL | Status: DC

## 2011-08-16 MED ORDER — DIGOXIN 125 MCG PO TABS: 0.1250 mg | ORAL_TABLET | Freq: Every day | ORAL | Status: DC

## 2011-08-16 NOTE — Discharge Instructions (Signed)
Atrial Fibrillation Your caregiver has diagnosed you with atrial fibrillation (AFib). The heart normally beats very regularly; AFib is a type of irregular heartbeat. The heart rate may be faster or slower than normal. This can prevent your heart from pumping as well as it should. AFib can be constant (chronic) or intermittent (paroxysmal). CAUSES  Atrial fibrillation may be caused by:  Heart disease, including heart attack, coronary artery disease, heart failure, diseases of the heart valves, and others.   Blood clot in the lungs (pulmonary embolism).   Pneumonia or other infections.   Chronic lung disease.   Thyroid disease.   Toxins. These include alcohol, some medications (such as decongestant medications or diet pills), and caffeine.  In some people, no cause for AFib can be found. This is referred to as Lone Atrial Fibrillation. SYMPTOMS   Palpitations or a fluttering in your chest.   A vague sense of chest discomfort.   Shortness of breath.   Sudden onset of lightheadedness or weakness.  Sometimes, the first sign of AFib can be a complication of the condition. This could be a stroke or heart failure. DIAGNOSIS  Your description of your condition may make your caregiver suspicious of atrial fibrillation. Your caregiver will examine your pulse to determine if fibrillation is present. An EKG (electrocardiogram) will confirm the diagnosis. Further testing may help determine what caused you to have atrial fibrillation. This may include chest x-ray, echocardiogram, blood tests, or CT scans. PREVENTION  If you have previously had atrial fibrillation, your caregiver may advise you to avoid substances known to cause the condition (such as stimulant medications, and possibly caffeine or alcohol). You may be advised to use medications to prevent recurrence. Proper treatment of any underlying condition is important to help prevent recurrence. PROGNOSIS  Atrial fibrillation does tend to  become a chronic condition over time. It can cause significant complications (see below). Atrial fibrillation is not usually immediately life-threatening, but it can shorten your life expectancy. This seems to be worse in women. If you have lone atrial fibrillation and are under 60 years old, the risk of complications is very low, and life expectancy is not shortened. RISKS AND COMPLICATIONS  Complications of atrial fibrillation can include stroke, chest pain, and heart failure. Your caregiver will recommend treatments for the atrial fibrillation, as well as for any underlying conditions, to help minimize risk of complications. TREATMENT  Treatment for AFib is divided into several categories:  Treatment of any underlying condition.   Converting you out of AFib into a regular (sinus) rhythm.   Controlling rapid heart rate.   Prevention of blood clots and stroke.  Medications and procedures are available to convert your atrial fibrillation to sinus rhythm. However, recent studies have shown that this may not offer you any advantage, and cardiac experts are continuing research and debate on this topic. More important is controlling your rapid heartbeat. The rapid heartbeat causes more symptoms, and places strain on your heart. Your caregiver will advise you on the use of medications that can control your heart rate. Atrial fibrillation is a strong stroke risk. You can lessen this risk by taking blood thinning medications such as Coumadin (warfarin), or sometimes aspirin. These medications need close monitoring by your caregiver. Over-medication can cause bleeding. Too little medication may not protect against stroke. HOME CARE INSTRUCTIONS   If your caregiver prescribed medicine to make your heartbeat more normally, take as directed.   If blood thinners were prescribed by your caregiver, take EXACTLY as directed.     Perform blood tests EXACTLY as directed.   Quit smoking. Smoking increases your  cardiac and lung (pulmonary) risks.   DO NOT drink alcohol.   DO NOT drink caffeinated drinks (e.g. coffee, soda, chocolate, and leaf teas). You may drink decaffeinated coffee, soda or tea.   If you are overweight, you should choose a reduced calorie diet to lose weight. Please see a registered dietitian if you need more information about healthy weight loss. DO NOT USE DIET PILLS as they may aggravate heart problems.   If you have other heart problems that are causing AFib, you may need to eat a low salt, fat, and cholesterol diet. Your caregiver will tell you if this is necessary.   Exercise every day to improve your physical fitness. Stay active unless advised otherwise.   If your caregiver has given you a follow-up appointment, it is very important to keep that appointment. Not keeping the appointment could result in heart failure or stroke. If there is any problem keeping the appointment, you must call back to this facility for assistance.  SEEK MEDICAL CARE IF:  You notice a change in the rate, rhythm or strength of your heartbeat.   You develop an infection or any other change in your overall health status.  SEEK IMMEDIATE MEDICAL CARE IF:   You develop chest pain, abdominal pain, sweating, weakness or feel sick to your stomach (nausea).   You develop shortness of breath.   You develop swollen feet and ankles.   You develop dizziness, numbness, or weakness of your face or limbs, or any change in vision or speech.  MAKE SURE YOU:   Understand these instructions.   Will watch your condition.   Will get help right away if you are not doing well or get worse.  Document Released: 05/14/2005 Document Revised: 05/03/2011 Document Reviewed: 12/17/2007 Exit[employer] Patient Information 2012 [employer]. 

## 2011-08-16 NOTE — Discharge Summary (Signed)
Patient ID: Johnathan [name],  MRN: [id number], DOB/AGE: 76-Apr-1917 76 y.o.  Admit date: 08/10/2011 Discharge date: 08/16/2011  Primary Care Provider: Dr Ricki Miller Primary Cardiologist: Dr Allyson Sabal  Discharge Diagnoses Principal Problem:  *Atrial fibrillation, with RVR on admission, (new) Active Problems:  Troponin level elevated, 0.74  CAD, LAD stent 2003, 2006, patent with distal disease this admission  Ischemic cardiomyopathy, 40-45%  HTN (hypertension)  Pacemaker, St Jude 2006    Procedures: Cardiac Cath 08/13/11                         TEE/CV 08/15/11  Hospital Course 78 y/o with a history of CAD, PAF/ SSS/ s/p PTVDP. He was recently seen by his primary MD and was noted to have a rapid HR. He was referred to our office and seen by Dr Allyson Sabal. EKG and pacer interrogation reviewed by Dr Allyson Sabal and Dr Royann Shivers and it was felt the pt had previously been in NSR so the AF recurrence was new. Dr Allyson Sabal adjusted his medications for rate control and added Xarelto. Unfortunately the pt's lab came back abnormal with an elevated Troponin and he was then admitted for heparin and set up for diagnostic cath. This was done 3/18 and the plan is for continued medical Rx. We attempted a TEE/CV 3/20 but this was unnsuccessful. The plan now is to ad Halina Maidens and Amiodarone and cardiovert in a few weeks.  At discharge it was decide to stop his Plavix, his last PCI was in 2006.  We'll continue Amiodarone 400mg  TID for 5 days, then decrease to 400mg  BID. He'll be seen in the office as an OP in a week. At that time we can decrease his beta blocker further as well, (on 100mg  BID).  Discharge Vitals:  Blood pressure 139/89, pulse 83, temperature 98.4 F (36.9 C), temperature source Oral, resp. rate 20, height 5\' 9"  (1.753 m), weight 65.635 kg (144 lb 11.2 oz), SpO2 97.00%.    Labs: Results for orders placed during the hospital encounter of 08/10/11 (from the past 48 hour(s))  CBC     Status: Abnormal   Collection Time   08/15/11  5:15 AM      Component Value Range Comment   WBC 3.8 (*) 4.0 - 10.5 (K/uL)    RBC 3.44 (*) 4.22 - 5.81 (MIL/uL)    Hemoglobin 11.7 (*) 13.0 - 17.0 (g/dL)    HCT 46.9 (*) 62.9 - 52.0 (%)    MCV 100.3 (*) 78.0 - 100.0 (fL)    MCH 34.0  26.0 - 34.0 (pg)    MCHC 33.9  30.0 - 36.0 (g/dL)    RDW 52.8  41.3 - 24.4 (%)    Platelets 126 (*) 150 - 400 (K/uL)   BASIC METABOLIC PANEL     Status: Abnormal   Collection Time   08/15/11  5:15 AM      Component Value Range Comment   Sodium 139  135 - 145 (mEq/L)    Potassium 3.4 (*) 3.5 - 5.1 (mEq/L)    Chloride 103  96 - 112 (mEq/L)    CO2 25  19 - 32 (mEq/L)    Glucose, Bld 94  70 - 99 (mg/dL)    BUN 13  6 - 23 (mg/dL)    Creatinine, Ser 0.10  0.50 - 1.35 (mg/dL)    Calcium 8.6  8.4 - 10.5 (mg/dL)    GFR calc non Af Amer 75 (*) >90 (mL/min)    GFR calc  Af Amer 87 (*) >90 (mL/min)   CBC     Status: Abnormal   Collection Time   08/16/11  6:20 AM      Component Value Range Comment   WBC 4.4  4.0 - 10.5 (K/uL)    RBC 3.46 (*) 4.22 - 5.81 (MIL/uL)    Hemoglobin 11.8 (*) 13.0 - 17.0 (g/dL)    HCT 62.9 (*) 52.8 - 52.0 (%)    MCV 101.2 (*) 78.0 - 100.0 (fL)    MCH 34.1 (*) 26.0 - 34.0 (pg)    MCHC 33.7  30.0 - 36.0 (g/dL)    RDW 41.3  24.4 - 01.0 (%)    Platelets 126 (*) 150 - 400 (K/uL)     Disposition:  Follow-up Information    Follow up with Runell Gess, MD. (office will call)    Contact information:   [address] [place] [address] [phone]          Discharge Medications:  Medication List  As of 08/16/2011  9:19 AM   STOP taking these medications         clopidogrel 75 MG tablet         TAKE these medications         acetaminophen 325 MG tablet   Commonly known as: TYLENOL   Take 2 tablets (650 mg total) by mouth every 4 (four) hours as needed.      amiodarone 400 MG tablet   Commonly known as: PACERONE   Take 1 tablet (400 mg total) by mouth 3 (three) times daily.       aspirin 81 MG chewable tablet   Chew 81 mg by mouth daily.      digoxin 0.125 MG tablet   Commonly known as: LANOXIN   Take 1 tablet (0.125 mg total) by mouth daily.      diltiazem 120 MG 24 hr tablet   Commonly known as: CARDIZEM LA   Take 1 tablet (120 mg total) by mouth daily.      diphenoxylate-atropine 2.5-0.025 MG per tablet   Commonly known as: LOMOTIL   Take 1 tablet by mouth 4 (four) times daily.      donepezil 10 MG tablet   Commonly known as: ARICEPT   Take 10 mg by mouth at bedtime as needed.      furosemide 20 MG tablet   Commonly known as: LASIX   Take 20 mg by mouth daily.      ICAPS PO   Take 1 tablet by mouth daily.      multivitamins ther. w/minerals Tabs   Take 1 tablet by mouth daily.      isosorbide mononitrate 15 mg Tb24   Commonly known as: IMDUR   Take 0.5 tablets (15 mg total) by mouth daily.      metoprolol 100 MG tablet   Commonly known as: LOPRESSOR   Take 100 mg by mouth 2 (two) times daily.      nitroGLYCERIN 0.4 MG SL tablet   Commonly known as: NITROSTAT   Place 1 tablet (0.4 mg total) under the tongue every 5 (five) minutes x 3 doses as needed for chest pain.      Rivaroxaban 20 MG Tabs   Take 20 mg by mouth daily before supper.            Outstanding Labs/Studies  Duration of Discharge Encounter: Greater than 30 minutes including physician time.  Jolene Provost PA-C 08/16/2011 9:19 AM

## 2011-08-16 NOTE — Progress Notes (Signed)
   CARE MANAGEMENT NOTE 08/16/2011  Patient:  Johnathan [name], Johnathan [name]   Account Number:  [id number]  Date Initiated:  08/16/2011  Documentation initiated by:  GRAVES-BIGELOW,Damonica Chopra  Subjective/Objective Assessment:   Pt admitted with rapid HR. TEE was unsuccessful plan now is to ad Halina Maidens and Amiodarone and cardiovert in a few weeks. Plan for home on xarelto.     Action/Plan:   CM provided pt a 10 day free card and will pick up rx at [facility] on [address]. Wife stating that she will not need any HH services at this time.   Anticipated DC Date:  08/16/2011   Anticipated DC Plan:  HOME/SELF CARE      DC Planning Services  CM consult      Choice offered to / List presented to:             Status of service:  Completed, signed off Medicare Important Message given?   (If response is "NO", the following Medicare IM given date fields will be blank) Date Medicare IM given:   Date Additional Medicare IM given:    Discharge Disposition:  HOME/SELF CARE  Per UR Regulation:    If discussed at Long Length of Stay Meetings, dates discussed:    Comments:

## 2011-08-16 NOTE — Progress Notes (Signed)
Subjective:  No complaints but looks mildly SOB at rest  Objective:  Vital Signs in the last 24 hours: Temp:  [97.3 F (36.3 C)-98.4 F (36.9 C)] 98.4 F (36.9 C) (03/21 0500) Pulse Rate:  [76-102] 83  (03/21 0500) Resp:  [12-23] 20  (03/21 0500) BP: (84-156)/(40-91) 139/89 mmHg (03/21 0500) SpO2:  [94 %-100 %] 97 % (03/21 0500) Weight:  [65.635 kg (144 lb 11.2 oz)] 65.635 kg (144 lb 11.2 oz) (03/21 0500)  Intake/Output from previous day: No intake or output data in the 24 hours ending 08/16/11 0856  Physical Exam: General appearance: alert, cooperative and no distress Lungs: decreased breath sounds Rt base Heart: irregularly irregular rhythm   Rate: 78  Rhythm: atrial fibrillation and pacing on demand  Lab Results:  Basename 08/16/11 0620 08/15/11 0515  WBC 4.4 3.8*  HGB 11.8* 11.7*  PLT 126* 126*    Basename 08/15/11 0515  NA 139  K 3.4*  CL 103  CO2 25  GLUCOSE 94  BUN 13  CREATININE 0.78   No results found for this basename: TROPONINI:2,CK,MB:2 in the last 72 hours Hepatic Function Panel No results found for this basename: PROT,ALBUMIN,AST,ALT,ALKPHOS,BILITOT,BILIDIR,IBILI in the last 72 hours No results found for this basename: CHOL in the last 72 hours  Basename 08/14/11 0500  INR 1.10    Imaging: No results found.  Cardiac Studies:  Assessment/Plan:   Principal Problem:  *Atrial fibrillation, with RVR on admission, (new) Active Problems:  Troponin level elevated, 0.74  CAD, LAD stent 2003, 2006, patent with distal disease this admission  Ischemic cardiomyopathy, 40-45%  HTN (hypertension)  Pacemaker, St Jude 2006  Plan-Discharge on Amio and Xarelto. OP f/u with Dr Allyson Sabal with plans for DCCV in a few weeks. I suspect he has COPD, O2 sats are OK     [employer] PA-C 08/16/2011, 8:56 AM

## 2011-08-19 ENCOUNTER — Inpatient Hospital Stay (HOSPITAL_COMMUNITY)
Admission: EM | Admit: 2011-08-19 | Discharge: 2011-08-24 | DRG: 177 | Disposition: A | Discharge status: Skilled Nursing Facility | Payer: Medicare Other | Attending: Cardiovascular Disease | Admitting: Cardiovascular Disease

## 2011-08-19 ENCOUNTER — Emergency Department (HOSPITAL_COMMUNITY): Payer: Medicare Other

## 2011-08-19 ENCOUNTER — Other Ambulatory Visit: Payer: Self-pay

## 2011-08-19 ENCOUNTER — Encounter (HOSPITAL_COMMUNITY): Payer: Self-pay | Admitting: Emergency Medicine

## 2011-08-19 DIAGNOSIS — I2510 Atherosclerotic heart disease of native coronary artery without angina pectoris: Secondary | ICD-10-CM | POA: Diagnosis present

## 2011-08-19 DIAGNOSIS — J42 Unspecified chronic bronchitis: Secondary | ICD-10-CM | POA: Diagnosis present

## 2011-08-19 DIAGNOSIS — I255 Ischemic cardiomyopathy: Secondary | ICD-10-CM | POA: Diagnosis present

## 2011-08-19 DIAGNOSIS — J9600 Acute respiratory failure, unspecified whether with hypoxia or hypercapnia: Secondary | ICD-10-CM | POA: Diagnosis present

## 2011-08-19 DIAGNOSIS — J189 Pneumonia, unspecified organism: Secondary | ICD-10-CM

## 2011-08-19 DIAGNOSIS — I10 Essential (primary) hypertension: Secondary | ICD-10-CM | POA: Diagnosis present

## 2011-08-19 DIAGNOSIS — Z950 Presence of cardiac pacemaker: Secondary | ICD-10-CM

## 2011-08-19 DIAGNOSIS — R042 Hemoptysis: Secondary | ICD-10-CM

## 2011-08-19 DIAGNOSIS — I252 Old myocardial infarction: Secondary | ICD-10-CM

## 2011-08-19 DIAGNOSIS — J690 Pneumonitis due to inhalation of food and vomit: Principal | ICD-10-CM | POA: Diagnosis present

## 2011-08-19 DIAGNOSIS — I428 Other cardiomyopathies: Secondary | ICD-10-CM | POA: Diagnosis present

## 2011-08-19 DIAGNOSIS — R0600 Dyspnea, unspecified: Secondary | ICD-10-CM

## 2011-08-19 DIAGNOSIS — Z87891 Personal history of nicotine dependence: Secondary | ICD-10-CM

## 2011-08-19 DIAGNOSIS — I4891 Unspecified atrial fibrillation: Secondary | ICD-10-CM | POA: Diagnosis present

## 2011-08-19 DIAGNOSIS — Z7982 Long term (current) use of aspirin: Secondary | ICD-10-CM

## 2011-08-19 DIAGNOSIS — I495 Sick sinus syndrome: Secondary | ICD-10-CM | POA: Diagnosis present

## 2011-08-19 DIAGNOSIS — N400 Enlarged prostate without lower urinary tract symptoms: Secondary | ICD-10-CM | POA: Diagnosis present

## 2011-08-19 LAB — BASIC METABOLIC PANEL
CO2: 26 mEq/L (ref 19–32)
Chloride: 99 mEq/L (ref 96–112)
Glucose, Bld: 106 mg/dL — ABNORMAL HIGH (ref 70–99)
Potassium: 3.6 mEq/L (ref 3.5–5.1)
Sodium: 137 mEq/L (ref 135–145)

## 2011-08-19 LAB — POCT I-STAT 3, ART BLOOD GAS (G3+)
Acid-Base Excess: 2 mmol/L (ref 0.0–2.0)
O2 Saturation: 89 %
TCO2: 28 mmol/L (ref 0–100)
pCO2 arterial: 42.4 mmHg (ref 35.0–45.0)

## 2011-08-19 LAB — CBC
HCT: 36.6 % — ABNORMAL LOW (ref 39.0–52.0)
Hemoglobin: 12.5 g/dL — ABNORMAL LOW (ref 13.0–17.0)
RBC: 3.63 MIL/uL — ABNORMAL LOW (ref 4.22–5.81)

## 2011-08-19 LAB — OCCULT BLOOD, POC DEVICE: Fecal Occult Bld: POSITIVE

## 2011-08-19 LAB — PRO B NATRIURETIC PEPTIDE: Pro B Natriuretic peptide (BNP): 5714 pg/mL — ABNORMAL HIGH (ref 0–450)

## 2011-08-19 MED ORDER — FUROSEMIDE 10 MG/ML IJ SOLN
40.0000 mg | Freq: Once | INTRAMUSCULAR | Status: AC
  Administered 2011-08-19: 40 mg via INTRAVENOUS
  Filled 2011-08-19: qty 4

## 2011-08-19 MED ORDER — ASPIRIN 81 MG PO CHEW
81.0000 mg | CHEWABLE_TABLET | Freq: Every day | ORAL | Status: DC
  Administered 2011-08-19 – 2011-08-24 (×6): 81 mg via ORAL
  Filled 2011-08-19 (×6): qty 1

## 2011-08-19 MED ORDER — ISOSORBIDE MONONITRATE 15 MG HALF TABLET
15.0000 mg | ORAL_TABLET | Freq: Every day | ORAL | Status: DC
  Administered 2011-08-19: 15 mg via ORAL
  Filled 2011-08-19: qty 1

## 2011-08-19 MED ORDER — ACETAMINOPHEN 325 MG PO TABS: 650.0000 mg | ORAL_TABLET | ORAL | Status: DC | PRN

## 2011-08-19 MED ORDER — ADULT MULTIVITAMIN W/MINERALS CH
1.0000 | ORAL_TABLET | Freq: Every day | ORAL | Status: DC
  Administered 2011-08-19 – 2011-08-24 (×6): 1 via ORAL
  Filled 2011-08-19 (×6): qty 1

## 2011-08-19 MED ORDER — LEVALBUTEROL HCL 0.63 MG/3ML IN NEBU
0.6300 mg | INHALATION_SOLUTION | Freq: Three times a day (TID) | RESPIRATORY_TRACT | Status: DC
  Administered 2011-08-19 – 2011-08-24 (×15): 0.63 mg via RESPIRATORY_TRACT
  Filled 2011-08-19 (×18): qty 3

## 2011-08-19 MED ORDER — DONEPEZIL HCL 10 MG PO TABS
10.0000 mg | ORAL_TABLET | Freq: Every day | ORAL | Status: DC
  Administered 2011-08-19 – 2011-08-23 (×5): 10 mg via ORAL
  Filled 2011-08-19 (×6): qty 1

## 2011-08-19 MED ORDER — AMIODARONE HCL 200 MG PO TABS
400.0000 mg | ORAL_TABLET | Freq: Every day | ORAL | Status: DC
  Administered 2011-08-19: 400 mg via ORAL
  Filled 2011-08-19 (×2): qty 2

## 2011-08-19 MED ORDER — FAMOTIDINE IN NACL 20-0.9 MG/50ML-% IV SOLN
20.0000 mg | Freq: Two times a day (BID) | INTRAVENOUS | Status: DC
  Administered 2011-08-19 – 2011-08-20 (×3): 20 mg via INTRAVENOUS
  Filled 2011-08-19 (×6): qty 50

## 2011-08-19 MED ORDER — ISOSORBIDE MONONITRATE ER 30 MG PO TB24
30.0000 mg | ORAL_TABLET | Freq: Every day | ORAL | Status: DC
  Administered 2011-08-20 – 2011-08-22 (×3): 30 mg via ORAL
  Filled 2011-08-19 (×4): qty 1

## 2011-08-19 MED ORDER — FUROSEMIDE 40 MG PO TABS
40.0000 mg | ORAL_TABLET | Freq: Two times a day (BID) | ORAL | Status: AC
  Administered 2011-08-20 – 2011-08-21 (×3): 40 mg via ORAL
  Filled 2011-08-19 (×3): qty 1

## 2011-08-19 MED ORDER — FUROSEMIDE 20 MG PO TABS
20.0000 mg | ORAL_TABLET | Freq: Every day | ORAL | Status: DC
  Filled 2011-08-19: qty 1

## 2011-08-19 MED ORDER — DIGOXIN 125 MCG PO TABS
0.1250 mg | ORAL_TABLET | Freq: Every day | ORAL | Status: DC
  Administered 2011-08-19 – 2011-08-23 (×5): 0.125 mg via ORAL
  Filled 2011-08-19 (×5): qty 1

## 2011-08-19 MED ORDER — PIPERACILLIN-TAZOBACTAM 3.375 G IVPB
3.3750 g | Freq: Three times a day (TID) | INTRAVENOUS | Status: DC
  Administered 2011-08-19 – 2011-08-22 (×8): 3.375 g via INTRAVENOUS
  Filled 2011-08-19 (×10): qty 50

## 2011-08-19 MED ORDER — NITROGLYCERIN 0.4 MG SL SUBL: 0.4000 mg | SUBLINGUAL_TABLET | SUBLINGUAL | Status: DC | PRN

## 2011-08-19 MED ORDER — LEVOFLOXACIN IN D5W 750 MG/150ML IV SOLN
750.0000 mg | INTRAVENOUS | Status: DC
  Administered 2011-08-19: 750 mg via INTRAVENOUS
  Filled 2011-08-19: qty 150

## 2011-08-19 MED ORDER — DILTIAZEM HCL ER COATED BEADS 120 MG PO TB24
120.0000 mg | ORAL_TABLET | Freq: Every day | ORAL | Status: DC
  Administered 2011-08-19 – 2011-08-22 (×4): 120 mg via ORAL
  Filled 2011-08-19 (×5): qty 1

## 2011-08-19 MED ORDER — THERA M PLUS PO TABS: 1.0000 | ORAL_TABLET | Freq: Every day | ORAL | Status: DC

## 2011-08-19 MED ORDER — GUAIFENESIN-DM 100-10 MG/5ML PO SYRP: 5.0000 mL | ORAL_SOLUTION | ORAL | Status: DC | PRN

## 2011-08-19 MED ORDER — DIPHENOXYLATE-ATROPINE 2.5-0.025 MG PO TABS: 1.0000 | ORAL_TABLET | Freq: Four times a day (QID) | ORAL | Status: DC

## 2011-08-19 MED ORDER — ALPRAZOLAM 0.25 MG PO TABS: 0.2500 mg | ORAL_TABLET | Freq: Two times a day (BID) | ORAL | Status: DC | PRN

## 2011-08-19 MED ORDER — METOPROLOL TARTRATE 100 MG PO TABS
100.0000 mg | ORAL_TABLET | Freq: Two times a day (BID) | ORAL | Status: DC
  Administered 2011-08-19 – 2011-08-22 (×8): 100 mg via ORAL
  Filled 2011-08-19 (×10): qty 1

## 2011-08-19 MED ORDER — DIPHENOXYLATE-ATROPINE 2.5-0.025 MG PO TABS
1.0000 | ORAL_TABLET | Freq: Four times a day (QID) | ORAL | Status: DC | PRN
  Administered 2011-08-20 – 2011-08-24 (×7): 1 via ORAL
  Filled 2011-08-19 (×7): qty 1

## 2011-08-19 NOTE — ED Notes (Signed)
Patient resting quietly with eyes closed family at bedside. NAD noted at this time.

## 2011-08-19 NOTE — Consult Note (Signed)
Reason for Consult: Hemoptysis Referring Physician: Augustine Leverette is an 78 y.o. male. With PMH relevant for CAD s/p MI treated with PCI, CHF with LVEF 40 to 45 %, bradycardia s/p pacemaker abd atrial fibrillation recently started on Xeralto. Recently had a failed TEE (3/20). He presents today complaining of increased SOB and hemoptysis described by the patient as approximately 5 episodes of cough with production of phlegm with bloody streaks. This resolved on its own. The patient complains of increased productive cough of yellowish sputum over the last 4 to 5 days. He denies to me fever, significant SOB or wheezing. Denies also orthopnea, PND or worsening LE edema. Of note the patient has history of heavy smoking of a pack per day since the age of 42. He quit 20 years ago. At the time of my examination the patient is awake, alert, oriented x 3, hemodynamically stable and saturating 98%  Lines/tubes: Peripheral IV    Microbiology/Sepsis markers: Results for orders placed during the hospital encounter of 08/19/11  MRSA PCR SCREENING     Status: Normal   Collection Time   08/19/11 12:00 PM      Component Value Range Status Comment   MRSA by PCR NEGATIVE  NEGATIVE  Final     Anti-infectives:  Anti-infectives     Start     Dose/Rate Route Frequency Ordered Stop   08/19/11 1015   Levofloxacin (LEVAQUIN) IVPB 750 mg        750 mg 100 mL/hr over 90 Minutes Intravenous Every 24 hours 08/19/11 1012 08/24/11 1014            Studies: Dg Chest 2 View  08/11/2011  *RADIOLOGY REPORT*  Clinical Data: Non ST elevation myocardial infarction; atrial fibrillation.  CHEST - 2 VIEW  Comparison: Chest radiograph performed 05/28/2011  Findings: The lungs are well-aerated.  Mildly increased interstitial markings are noted.  There is no evidence of focal opacification, pleural effusion or pneumothorax.  The heart is borderline enlarged; calcification is noted within the aortic arch.  A  pacemaker is seen at the right chest wall, with leads ending at the right atrium and right ventricle.  No acute osseous abnormalities are seen.  IMPRESSION: Borderline cardiomegaly; no acute cardiopulmonary process seen.  Original Report Authenticated By: Tonia Ghent, M.D.   Dg Chest Portable 1 View  08/19/2011  *RADIOLOGY REPORT*  Clinical Data: Shortness of breath, history of CAD, I  PORTABLE CHEST - 1 VIEW  Comparison: 08/11/2011; 05/28/2011; 01/19/2010  Findings:  Examination is minimally degraded secondary to exclusion of the left costophrenic angle. Grossly unchanged enlarged cardiac silhouette and mediastinal contours.  Stable position of support apparatus.  Interval development of right basilar heterogeneous air space opacities. There is persistent blunting of the right costophrenic angle.  No definite pleural effusion.  No pneumothorax.  Grossly unchanged bones.  IMPRESSION: Interval development of right mid and lower lung heterogeneous air space opacities worrisome for infection and/or aspiration.  A follow-up chest radiograph in 4 to 6 weeks after treatment is recommended to ensure resolution.  Original Report Authenticated By: Waynard Reeds, M.D.      Past Medical History  Diagnosis Date  . Myocardial infarct 2003    LAD stent  . Coronary artery disease 2006    re do LAD stent  . Bradycardia 2006    St Jude pacemaker  . BPH (benign prostatic hyperplasia)   . Dysrhythmia   . Pacemaker   . Shortness of breath   .  Arthritis     Past Surgical History  Procedure Date  . Pacemaker insertion   . Appendectomy   . Cardiac catheterization   . Insert / replace / remove pacemaker   . Tee without cardioversion 08/15/2011    Procedure: TRANSESOPHAGEAL ECHOCARDIOGRAM (TEE);  Surgeon: [name], MD;  Location: [facility] ENDOSCOPY;  Service: Cardiovascular;  Laterality: N/A;  . Cardioversion 08/15/2011    Procedure: CARDIOVERSION;  Surgeon: [name], MD;  Location: [facility] ENDOSCOPY;   Service: Cardiovascular;  Laterality: N/A;    Family  History: No known history of cancer  Social History: History of smoking since the age of 88. Used to smoke a pack per day and quit 20 years ago. Drings wine every night. Denies illicit drug use.  Allergies: No Known Allergies  Medications: I have reviewed the patient's current medications.  Physical exam:  Vitals: Blood pressure 114/57, pulse 83, temperature 98.3 F (36.8 C), temperature source Oral, resp. rate 20, height 5\' 9"  (1.753 m), weight 144 lb 11.2 oz (65.635 kg), SpO2 99.00%.  General: Pleasant male patient in no apparent distress. Eyes: Anicteric sclerae. ENT: Oropharynx clear. Moist mucous membranes. No thrush Lymph: No cervical, supraclavicular, or axillary lymphadenopathy. Heart: Normal S1, S2. No murmurs, rubs, or gallops appreciated. No bruits, equal pulses. Lungs: Diminished breath sounds bilaterally, Right base crackles. Left lung with mild basilar crackles. No wheezing. Abdomen: Abdomen soft, non-tender and not distended, normoactive bowel sounds. No hepatosplenomegaly or masses. Musculoskeletal: No clubbing or synovitis. Skin: No rashes or lesions Neuro: No focal neurologic deficits.  LABS Results for orders placed during the hospital encounter of 08/19/11 (from the past 24 hour(s))  OCCULT BLOOD, POC DEVICE     Status: Normal   Collection Time   08/19/11  7:10 AM      Component Value Range   Fecal Occult Bld POSITIVE    CBC     Status: Abnormal   Collection Time   08/19/11  7:12 AM      Component Value Range   WBC 4.9  4.0 - 10.5 (K/uL)   RBC 3.63 (*) 4.22 - 5.81 (MIL/uL)   Hemoglobin 12.5 (*) 13.0 - 17.0 (g/dL)   HCT 16.1 (*) 09.6 - 52.0 (%)   MCV 100.8 (*) 78.0 - 100.0 (fL)   MCH 34.4 (*) 26.0 - 34.0 (pg)   MCHC 34.2  30.0 - 36.0 (g/dL)   RDW 04.5  40.9 - 81.1 (%)   Platelets 138 (*) 150 - 400 (K/uL)  BASIC METABOLIC PANEL     Status: Abnormal   Collection Time   08/19/11  7:12 AM       Component Value Range   Sodium 137  135 - 145 (mEq/L)   Potassium 3.6  3.5 - 5.1 (mEq/L)   Chloride 99  96 - 112 (mEq/L)   CO2 26  19 - 32 (mEq/L)   Glucose, Bld 106 (*) 70 - 99 (mg/dL)   BUN 21  6 - 23 (mg/dL)   Creatinine, Ser 9.14  0.50 - 1.35 (mg/dL)   Calcium 8.6  8.4 - 78.2 (mg/dL)   GFR calc non Af Amer 81 (*) >90 (mL/min)   GFR calc Af Amer >90  >90 (mL/min)  DIGOXIN LEVEL     Status: Normal   Collection Time   08/19/11  7:12 AM      Component Value Range   Digoxin Level 1.2  0.8 - 2.0 (ng/mL)  POCT I-STAT TROPONIN I     Status: Normal  Collection Time   08/19/11  7:37 AM      Component Value Range   Troponin i, poc 0.03  0.00 - 0.08 (ng/mL)   Comment 3           POCT I-STAT 3, BLOOD GAS (G3+)     Status: Abnormal   Collection Time   08/19/11  7:37 AM      Component Value Range   pH, Arterial 7.406  7.350 - 7.450    pCO2 arterial 42.4  35.0 - 45.0 (mmHg)   pO2, Arterial 56.0 (*) 80.0 - 100.0 (mmHg)   Bicarbonate 26.7 (*) 20.0 - 24.0 (mEq/L)   TCO2 28  0 - 100 (mmol/L)   O2 Saturation 89.0     Acid-Base Excess 2.0  0.0 - 2.0 (mmol/L)   Collection site RADIAL, ALLEN'S TEST ACCEPTABLE     Drawn by Operator     Sample type ARTERIAL    PRO B NATRIURETIC PEPTIDE     Status: Abnormal   Collection Time   08/19/11  8:44 AM      Component Value Range   Pro B Natriuretic peptide (BNP) 5714.0 (*) 0 - 450 (pg/mL)  MRSA PCR SCREENING     Status: Normal   Collection Time   08/19/11 12:00 PM      Component Value Range   MRSA by PCR NEGATIVE  NEGATIVE       Assessment: 78 y.o. male. With PMH relevant for CAD s/p MI treated with PCI, CHF with LVEF 40 to 45 %, bradycardia s/p pacemaker abd atrial fibrillation recently started on Xeralto. Recently had a failed TEE (3/20). Presents with increased productive cough with bloody streaks. Denies fever or malaise. Chest X ray showed a new RLL infiltrate concerning for pneumonia. His Hgb is stable and doubt there is significant hemoptysis  since he has not had any more episodes of hemoptysis while in the hospital. He is hemodynamically stable saturating 98% on 2 L [place]. The patient seems to be euvolemic so I doubt CHF is playing a role. He does not have symptoms suggestive of autoimmune disease and his kidney function is normal.  It is possible that this infiltrate is related to an episode of aspiration following his attempted TEE. The patient was started recently on Xarelto and stopped at admission. Of note, the patient has significant smoking history and in the presence of hemoptysis malignancy is  in the differential. He does not have diagnosis of COPD and will need PFT's. Diagnosis: 1) Hemoptysis 2) Pneumonia possibly secondary to aspiration  Recommendations: 1) Would recommend to D/C Levaquin and start Zosyn to cover for aspiration pneumonia. 2) Continue to monitor for drop in his Hgb or frank hemoptysis. 3) No need for bronchoscopy for now. 4) Agree with stopping Xarelto. 5) Recommend non contrast CT of the chest to rule out malignancy  6) Recommend PFT's  to evaluate for obstructive lung disease. 7) Recommend swallow evaluation to assess for aspiration.     Overton Mam 08/19/2011, 8:43 PM

## 2011-08-19 NOTE — ED Notes (Signed)
Called out woke up feeling short of breath and spitting pink frothy sputum. Diminished lung sounds placed on cpap and one NTG given

## 2011-08-19 NOTE — H&P (Addendum)
Patient ID: Johnathan [name] MRN: [id number], DOB/AGE: 1915-12-10   Admit date: 08/19/2011   Primary Physician: Juline Patch, MD, MD Primary Cardiologist: Dr Allyson Sabal  HPI: 78 y/o with a history of CAD, PAF/ SSS/ s/p PTVDP. He was recently seen by his primary MD and was noted to have a rapid HR. He was referred to our office and seen by Dr Allyson Sabal. EKG and pacer interrogation reviewed by Dr Allyson Sabal and Dr Royann Shivers and it was felt the pt had previously been in NSR so the AF recurrence was new. Dr Allyson Sabal adjusted his medications for rate control and added Xarelto. Unfortunately the pt's lab came back abnormal with an elevated Troponin and he was then admitted for heparin and set up for diagnostic cath. This was done 3/18 and the plan is for continued medical Rx. We attempted a TEE/CV 3/20 but this was unnsuccessful. The plan now is to ad Halina Maidens and Amiodarone and cardiovert in a few weeks. At discharge it was decide to stop his Plavix, his last PCI was in 2006. We'll continue Amiodarone 400mg  TID for 5 days, then decrease to 400mg  BID. On 08/18/11 it was noted the patient was somewhat "sleepy" but eating and drinking OK. This am he was SOB and had hemoptysis. In the ER his CXR suggest RLL infiltrate/pnuemonia. His PO2 was in the 50s on admission. WBC is WNL and he is afebrile. There is concern he has had a pulmonary bleed on Xarelto vs aspiration pneumonia after TEE.    Past Medical History  Diagnosis Date  . Myocardial infarct 2003    LAD stent  . Coronary artery disease 2006    re do LAD stent  . Bradycardia 2006    St Jude pacemaker  . BPH (benign prostatic hyperplasia)   . Dysrhythmia   . Pacemaker   . Shortness of breath   . Arthritis     Past Surgical History  Procedure Date  . Pacemaker insertion   . Appendectomy   . Cardiac catheterization   . Insert / replace / remove pacemaker   . Tee without cardioversion 08/15/2011    Procedure: TRANSESOPHAGEAL ECHOCARDIOGRAM (TEE);  Surgeon: [name], MD;  Location: [facility] ENDOSCOPY;  Service: Cardiovascular;  Laterality: N/A;  . Cardioversion 08/15/2011    Procedure: CARDIOVERSION;  Surgeon: [name], MD;  Location: [facility] ENDOSCOPY;  Service: Cardiovascular;  Laterality: N/A;     Allergies: No Known Allergies   Home Medications Prescriptions prior to admission  Medication Sig Dispense Refill  . acetaminophen (TYLENOL) 325 MG tablet Take 2 tablets (650 mg total) by mouth every 4 (four) hours as needed.      Marland Kitchen amiodarone (PACERONE) 400 MG tablet Take 1 tablet (400 mg total) by mouth 3 (three) times daily.  50 tablet  3  . aspirin 81 MG chewable tablet Chew 81 mg by mouth daily.        . digoxin (LANOXIN) 0.125 MG tablet Take 1 tablet (0.125 mg total) by mouth daily.  30 tablet  5  . diltiazem (CARDIZEM LA) 120 MG 24 hr tablet Take 1 tablet (120 mg total) by mouth daily.  30 tablet  5  . diphenoxylate-atropine (LOMOTIL) 2.5-0.025 MG per tablet Take 1 tablet by mouth 4 (four) times daily.        Marland Kitchen donepezil (ARICEPT) 10 MG tablet Take 10 mg by mouth at bedtime as needed.        . furosemide (LASIX) 20 MG tablet Take 20 mg by mouth daily.      Marland Kitchen  isosorbide mononitrate (IMDUR) 15 mg TB24 Take 0.5 tablets (15 mg total) by mouth daily.  30 tablet  5  . metoprolol (LOPRESSOR) 100 MG tablet Take 100 mg by mouth 2 (two) times daily.        . Multiple Vitamins-Minerals (ICAPS PO) Take 1 tablet by mouth daily.        . Multiple Vitamins-Minerals (MULTIVITAMINS THER. W/MINERALS) TABS Take 1 tablet by mouth daily.        . rivaroxaban 20 MG TABS Take 20 mg by mouth daily before supper.  30 tablet  5  . nitroGLYCERIN (NITROSTAT) 0.4 MG SL tablet Place 1 tablet (0.4 mg total) under the tongue every 5 (five) minutes x 3 doses as needed for chest pain.  30 tablet  5     No family history on file.   History   Social History  . Marital Status: Widowed    Spouse Name: N/A    Number of Children: N/A  . Years of Education: N/A    Occupational History  . Not on file.   Social History Main Topics  . Smoking status: Former Smoker    Quit date: 08/09/1973  . Smokeless tobacco: Not on file  . Alcohol Use: 1.2 oz/week    2 Glasses of wine per week  . Drug Use: No  . Sexually Active:    Other Topics Concern  . Not on file   Social History Narrative  . No narrative on file     Review of Systems: General: negative for chills, fever, night sweats or weight changes.  Cardiovascular: negative for chest pain, dyspnea on exertion, edema, orthopnea, palpitations, paroxysmal nocturnal dyspnea or shortness of breath Dermatological: negative for rash Respiratory: negative for cough or wheezing Urologic: negative for hematuria Abdominal: negative for nausea, vomiting, diarrhea, bright red blood per rectum, melena, or hematemesis Neurologic: negative for visual changes, syncope, or dizziness All other systems reviewed and are otherwise negative except as noted above.  Physical Exam: Blood pressure 136/104, pulse 69, temperature 98.1 F (36.7 C), temperature source Oral, resp. rate 26, SpO2 95.00%.  BP 136/104  Pulse 69  Temp(Src) 98.1 F (36.7 C) (Oral)  Resp 26  SpO2 95% upon arrival to CCU General appearance: alert, cooperative and mild distress Neck: no carotid bruit, no JVD and supple, symmetrical, trachea midline Lungs: decreased breath sounds Rt 1/3 -- diffuse rhonchorous-rales thoroughout. Heart: regular rate and rhythm Abdomen: non tender not distended Extremities: no edema Pulses: 2+ and symmetric Skin: Skin color, texture, turgor normal. No rashes or lesions or cool and dry Neurologic: Grossly normal  Labs:   Results for orders placed during the hospital encounter of 08/19/11 (from the past 24 hour(s))  OCCULT BLOOD, POC DEVICE     Status: Normal   Collection Time   08/19/11  7:10 AM      Component Value Range   Fecal Occult Bld POSITIVE    CBC     Status: Abnormal   Collection Time    08/19/11  7:12 AM      Component Value Range   WBC 4.9  4.0 - 10.5 (K/uL)   RBC 3.63 (*) 4.22 - 5.81 (MIL/uL)   Hemoglobin 12.5 (*) 13.0 - 17.0 (g/dL)   HCT 91.4 (*) 78.2 - 52.0 (%)   MCV 100.8 (*) 78.0 - 100.0 (fL)   MCH 34.4 (*) 26.0 - 34.0 (pg)   MCHC 34.2  30.0 - 36.0 (g/dL)   RDW 95.6  21.3 - 08.6 (%)  Platelets 138 (*) 150 - 400 (K/uL)  BASIC METABOLIC PANEL     Status: Abnormal   Collection Time   08/19/11  7:12 AM      Component Value Range   Sodium 137  135 - 145 (mEq/L)   Potassium 3.6  3.5 - 5.1 (mEq/L)   Chloride 99  96 - 112 (mEq/L)   CO2 26  19 - 32 (mEq/L)   Glucose, Bld 106 (*) 70 - 99 (mg/dL)   BUN 21  6 - 23 (mg/dL)   Creatinine, Ser 4.09  0.50 - 1.35 (mg/dL)   Calcium 8.6  8.4 - 81.1 (mg/dL)   GFR calc non Af Amer 81 (*) >90 (mL/min)   GFR calc Af Amer >90  >90 (mL/min)  DIGOXIN LEVEL     Status: Normal   Collection Time   08/19/11  7:12 AM      Component Value Range   Digoxin Level 1.2  0.8 - 2.0 (ng/mL)  POCT I-STAT TROPONIN I     Status: Normal   Collection Time   08/19/11  7:37 AM      Component Value Range   Troponin i, poc 0.03  0.00 - 0.08 (ng/mL)   Comment 3           POCT I-STAT 3, BLOOD GAS (G3+)     Status: Abnormal   Collection Time   08/19/11  7:37 AM      Component Value Range   pH, Arterial 7.406  7.350 - 7.450    pCO2 arterial 42.4  35.0 - 45.0 (mmHg)   pO2, Arterial 56.0 (*) 80.0 - 100.0 (mmHg)   Bicarbonate 26.7 (*) 20.0 - 24.0 (mEq/L)   TCO2 28  0 - 100 (mmol/L)   O2 Saturation 89.0     Acid-Base Excess 2.0  0.0 - 2.0 (mmol/L)   Collection site RADIAL, ALLEN'S TEST ACCEPTABLE     Drawn by Operator     Sample type ARTERIAL    PRO B NATRIURETIC PEPTIDE     Status: Abnormal   Collection Time   08/19/11  8:44 AM      Component Value Range   Pro B Natriuretic peptide (BNP) 5714.0 (*) 0 - 450 (pg/mL)     Radiology/Studies: Dg Chest 2 View  08/11/2011  *RADIOLOGY REPORT*  Clinical Data: Non ST elevation myocardial infarction; atrial  fibrillation.  CHEST - 2 VIEW  Comparison: Chest radiograph performed 05/28/2011  Findings: The lungs are well-aerated.  Mildly increased interstitial markings are noted.  There is no evidence of focal opacification, pleural effusion or pneumothorax.  The heart is borderline enlarged; calcification is noted within the aortic arch.  A pacemaker is seen at the right chest wall, with leads ending at the right atrium and right ventricle.  No acute osseous abnormalities are seen.  IMPRESSION: Borderline cardiomegaly; no acute cardiopulmonary process seen.  Original Report Authenticated By: Tonia Ghent, M.D.   Dg Chest Portable 1 View -- Personally reviewed.  08/19/2011  *RADIOLOGY REPORT*  Clinical Data: Shortness of breath, history of CAD, I  PORTABLE CHEST - 1 VIEW  Comparison: 08/11/2011; 05/28/2011; 01/19/2010  Findings:  Examination is minimally degraded secondary to exclusion of the left costophrenic angle. Grossly unchanged enlarged cardiac silhouette and mediastinal contours.  Stable position of support apparatus.  Interval development of right basilar heterogeneous air space opacities. There is persistent blunting of the right costophrenic angle.  No definite pleural effusion.  No pneumothorax.  Grossly unchanged bones.  IMPRESSION: Interval development  of right mid and lower lung heterogeneous air space opacities worrisome for infection and/or aspiration.  A follow-up chest radiograph in 4 to 6 weeks after treatment is recommended to ensure resolution.  Original Report Authenticated By: Waynard Reeds, M.D.    EKG:AF, V-paced  ASSESSMENT AND PLAN:  Principal Problem:  *Respiratory failure Active Problems:  Atrial fibrillation, s/p recent failed TEE/CV, on AMIO and Xarelto  CAD, LAD stent 2003, 2006, patent with distal disease this admission  HTN (hypertension)  Pacemaker, St Jude 2006  Cardiomyopathy, EF 45%  Possible: Hemoptysis,  from Xarelto   Hypoxia, PO2 50s  Plan-admit, pulmonary  consult, stop Xarelto, start ABs, lasix X 1 (BNP 5714). Will also decrease Amiodarone to 400mg  daily.  Signed, Nesanel Aguila W, PA-C 08/19/2011, 3:12 PM  I have seen and examined the patient along with Corine Shelter, PA.  I have reviewed the chart, notes and new data.  I agree with Luke's note.  Brief Description: 78 y/o man with h/o CAD (PCI), ICM - Ef 45%, s/p PPM placement with recent hospitalization for Afib.  Planned d/c on Xarelto & Amiodarone after failed TEE DCCV last week.  Sudden onset difficulty breathing (reported by EMS - "tripoding", placed on CPAP -- easily weaned in ER).  CXR suggestive of PNA, but with suggested hemoptysis (one report of frank blood, others of pink-frothy sputum) --> concern for pulmonary hemorrhage but Acute on chronic Systolic/Diastolic HF with AF is more likely & made worse by PNA.  Key new complaints: Coughing & SOB - but on [place] O2.  Main issue is coughing.  Breathing better now & lying down. Key examination changes: diffuse rhonchi/rales (rattling); JVP to angle of jaw. Key new findings / data: BNP & CXR reviewed.    PLAN: This picture is not clear -- sudden onset of dyspnea with pink frothy sputum --> CHF exacerbation is high on differential & PNA suggested by CXR & cough & his general sense of "not feeling well" & "feeling cold".   I agree with initating ABx (? If aspiration event from TEE -- may need to broaden coverage to GI flora & consider HAP as he was recently dischaged) -- we have consulted PCCM to assist with eval & Rx.  Will add inhales & cough suppressant.  For A on C Combined CHF: Given IV Lasix - so far only 450 ml out; will increase PO dose (may give one more IV dose if SaO2 worsens -- however with him lying flat, I am not convinced of severe CHF)  Afib : rate moderately controlled, due for decreased Amiodarone dose, & Digoxin, BB  With ? Hemoptysis (& Guaiac + stools), agree with holding anticoagulation for now --> but would need to restart  once "bleeding concern" resolved as the plan is ultimately to proceed with DCCV.  Will need DVT prophylaxis starting tomorrow if Xarelto not restarted. GI prophylaxis - H2 blocker.  Marykay Lex, M.D., M.S. [facility] [address] [place], [place]  [address]  [phone]  08/19/2011 3:12 PM

## 2011-08-19 NOTE — ED Provider Notes (Signed)
History     CSN: [id number]  Arrival date & time 08/19/11  0700   First MD Initiated Contact with Patient 08/19/11 [phone]      Chief Complaint  Patient presents with  . Shortness of Breath    (Consider location/radiation/quality/duration/timing/severity/associated sxs/prior treatment) HPI Level V caveat dyspnea history is from paramedics. Paramedics were called to scene noted patient be coughing up pink frothy material. Patient was noted to be in tripod position EMS treated patient with CPAP, supplemental oxygen and one sublingual nitroglycerin. Attempt at IV access without success. Patient complains of general malaise no other symptoms. EMS reports patient looks markedly improved after their treatment Past Medical History  Diagnosis Date  . Myocardial infarct 2003    LAD stent  . Coronary artery disease 2006    re do LAD stent  . Bradycardia 2006    St Jude pacemaker  . BPH (benign prostatic hyperplasia)   . Dysrhythmia   . Pacemaker   . Shortness of breath   . Arthritis     Past Surgical History  Procedure Date  . Pacemaker insertion   . Appendectomy   . Cardiac catheterization   . Insert / replace / remove pacemaker   . Tee without cardioversion 08/15/2011    Procedure: TRANSESOPHAGEAL ECHOCARDIOGRAM (TEE);  Surgeon: [name], MD;  Location: [facility] ENDOSCOPY;  Service: Cardiovascular;  Laterality: N/A;  . Cardioversion 08/15/2011    Procedure: CARDIOVERSION;  Surgeon: [name], MD;  Location: [facility] ENDOSCOPY;  Service: Cardiovascular;  Laterality: N/A;    No family history on file.  History  Substance Use Topics  . Smoking status: Former Smoker    Quit date: 08/09/1973  . Smokeless tobacco: Not on file  . Alcohol Use: 1.2 oz/week    2 Glasses of wine per week      Review of Systems  Unable to perform ROS: Unstable vital signs    Allergies  Review of patient's allergies indicates no known allergies.  Home Medications   Current Outpatient Rx    Name Route Sig Dispense Refill  . ACETAMINOPHEN 325 MG PO TABS Oral Take 2 tablets (650 mg total) by mouth every 4 (four) hours as needed.    . AMIODARONE HCL 400 MG PO TABS Oral Take 1 tablet (400 mg total) by mouth 3 (three) times daily. 50 tablet 3    Take one tablet every 8 hours for the next 5 days, ...  . ASPIRIN 81 MG PO CHEW Oral Chew 81 mg by mouth daily.      Marland Kitchen DIGOXIN 0.125 MG PO TABS Oral Take 1 tablet (0.125 mg total) by mouth daily. 30 tablet 5  . DILTIAZEM HCL ER COATED BEADS 120 MG PO TB24 Oral Take 1 tablet (120 mg total) by mouth daily. 30 tablet 5  . DIPHENOXYLATE-ATROPINE 2.5-0.025 MG PO TABS Oral Take 1 tablet by mouth 4 (four) times daily.      . DONEPEZIL HCL 10 MG PO TABS Oral Take 10 mg by mouth at bedtime as needed.      . FUROSEMIDE 20 MG PO TABS Oral Take 20 mg by mouth daily.    . ISOSORBIDE MONONITRATE 15 MG HALF TABLET Oral Take 0.5 tablets (15 mg total) by mouth daily. 30 tablet 5  . METOPROLOL TARTRATE 100 MG PO TABS Oral Take 100 mg by mouth 2 (two) times daily.      . ICAPS PO Oral Take 1 tablet by mouth daily.      Carma Leaven  M PLUS PO TABS Oral Take 1 tablet by mouth daily.      Marland Kitchen NITROGLYCERIN 0.4 MG SL SUBL Sublingual Place 1 tablet (0.4 mg total) under the tongue every 5 (five) minutes x 3 doses as needed for chest pain. 30 tablet 5  . RIVAROXABAN 20 MG PO TABS Oral Take 20 mg by mouth daily before supper. 30 tablet 5    BP 165/83  Pulse 82  Temp(Src) 100 F (37.8 C) (Rectal)  Resp 21  SpO2 93%  Physical Exam  Nursing note and vitals reviewed. Constitutional:       Chronically ill-appearing  HENT:  Head: Normocephalic.  Eyes: EOM are normal.  Neck: Normal range of motion. Neck supple. No tracheal deviation present. No thyromegaly present.       Positive JVD  Cardiovascular: Normal rate.        Irregular  Pulmonary/Chest: He exhibits no tenderness.       Tachypnea, rhonchi bilaterally at bases  Abdominal: Soft. He exhibits no mass. There is  no tenderness.  Genitourinary: Guaiac positive stool.       Brown stool nontender  Musculoskeletal: He exhibits no edema.  Neurological: He is alert.  Skin: Skin is warm and dry.    ED Course  Procedures (including critical care time) Patient taken off of BiPAP, left on room air pulse ox on room air ranged from 90-99% , normal. Peripheral IV consisting of saline lock started by RN  Labs Reviewed  OCCULT BLOOD, POC DEVICE  CBC  BASIC METABOLIC PANEL  DIGOXIN LEVEL  BLOOD GAS, ARTERIAL  PRO B NATRIURETIC PEPTIDE   No results found.   Date: 08/19/2011  Rate: 90  Rhythm: Electronically paced  QRS Axis: normal  Intervals: normal  ST/T Wave abnormalities: nonspecific ST/T changes  Conduction Disutrbances:none  Narrative Interpretation:   Old EKG Reviewed: unchanged PVCs present unchanged from 08/12/2011 Blood gas on room air: PH 7.40 PCO2 42 PO2 56, consistent with mild hypoxemia No diagnosis found.  7:30 AM patient's son arrived, states this morning the patient became extremely dyspneic after walking up to walk to go to the bathroom, prompting call to 911. Son states he was coughing up blood  7:32 AM patient's speaks in sentences audible rhonchi pulse oximetry on room air 89% oxygen 2 L nasal cannula ordered by me Results for orders placed during the hospital encounter of 08/19/11  OCCULT BLOOD, POC DEVICE      Component Value Range   Fecal Occult Bld POSITIVE    CBC      Component Value Range   WBC 4.9  4.0 - 10.5 (K/uL)   RBC 3.63 (*) 4.22 - 5.81 (MIL/uL)   Hemoglobin 12.5 (*) 13.0 - 17.0 (g/dL)   HCT 86.5 (*) 78.4 - 52.0 (%)   MCV 100.8 (*) 78.0 - 100.0 (fL)   MCH 34.4 (*) 26.0 - 34.0 (pg)   MCHC 34.2  30.0 - 36.0 (g/dL)   RDW 69.6  29.5 - 28.4 (%)   Platelets 138 (*) 150 - 400 (K/uL)  BASIC METABOLIC PANEL      Component Value Range   Sodium 137  135 - 145 (mEq/L)   Potassium 3.6  3.5 - 5.1 (mEq/L)   Chloride 99  96 - 112 (mEq/L)   CO2 26  19 - 32 (mEq/L)    Glucose, Bld 106 (*) 70 - 99 (mg/dL)   BUN 21  6 - 23 (mg/dL)   Creatinine, Ser 1.32  0.50 - 1.35 (mg/dL)  Calcium 8.6  8.4 - 10.5 (mg/dL)   GFR calc non Af Amer 81 (*) >90 (mL/min)   GFR calc Af Amer >90  >90 (mL/min)  DIGOXIN LEVEL      Component Value Range   Digoxin Level 1.2  0.8 - 2.0 (ng/mL)  POCT I-STAT TROPONIN I      Component Value Range   Troponin i, poc 0.03  0.00 - 0.08 (ng/mL)   Comment 3           POCT I-STAT 3, BLOOD GAS (G3+)      Component Value Range   pH, Arterial 7.406  7.350 - 7.450    pCO2 arterial 42.4  35.0 - 45.0 (mmHg)   pO2, Arterial 56.0 (*) 80.0 - 100.0 (mmHg)   Bicarbonate 26.7 (*) 20.0 - 24.0 (mEq/L)   TCO2 28  0 - 100 (mmol/L)   O2 Saturation 89.0     Acid-Base Excess 2.0  0.0 - 2.0 (mmol/L)   Collection site RADIAL, ALLEN'S TEST ACCEPTABLE     Drawn by Operator     Sample type ARTERIAL     Dg Chest 2 View  08/11/2011  *RADIOLOGY REPORT*  Clinical Data: Non ST elevation myocardial infarction; atrial fibrillation.  CHEST - 2 VIEW  Comparison: Chest radiograph performed 05/28/2011  Findings: The lungs are well-aerated.  Mildly increased interstitial markings are noted.  There is no evidence of focal opacification, pleural effusion or pneumothorax.  The heart is borderline enlarged; calcification is noted within the aortic arch.  A pacemaker is seen at the right chest wall, with leads ending at the right atrium and right ventricle.  No acute osseous abnormalities are seen.  IMPRESSION: Borderline cardiomegaly; no acute cardiopulmonary process seen.  Original Report Authenticated By: Tonia Ghent, M.D.   Dg Chest Portable 1 View  08/19/2011  *RADIOLOGY REPORT*  Clinical Data: Shortness of breath, history of CAD, I  PORTABLE CHEST - 1 VIEW  Comparison: 08/11/2011; 05/28/2011; 01/19/2010  Findings:  Examination is minimally degraded secondary to exclusion of the left costophrenic angle. Grossly unchanged enlarged cardiac silhouette and mediastinal contours.   Stable position of support apparatus.  Interval development of right basilar heterogeneous air space opacities. There is persistent blunting of the right costophrenic angle.  No definite pleural effusion.  No pneumothorax.  Grossly unchanged bones.  IMPRESSION: Interval development of right mid and lower lung heterogeneous air space opacities worrisome for infection and/or aspiration.  A follow-up chest radiograph in 4 to 6 weeks after treatment is recommended to ensure resolution.  Original Report Authenticated By: Waynard Reeds, M.D.    MDM  In light of patient's hemoptysis and recently being started on Xeralto, possibility of pulmonary hemorrhage.Other possibilties  In differntialinclude CHF, pneumonia(doubtful) Spoke with Dr. Herbie [place] who will come to evaluate patient Diagnosis #1 dyspnea #2 hypoxemia #3 hemoptysis #4 trace heme positive stool.   CRITICAL CARE Performed by: Doug Sou   Total critical care time: 30 minutes  Critical care time was exclusive of separately billable procedures and treating other patients.  Critical care was necessary to treat or prevent imminent or life-threatening deterioration.  Critical care was time spent personally by me on the following activities: development of treatment plan with patient and/or surrogate as well as nursing, discussions with consultants, evaluation of patient's response to treatment, examination of patient, obtaining history from patient or surrogate, ordering and performing treatments and interventions, ordering and review of laboratory studies, ordering and review of radiographic studies, pulse oximetry and re-evaluation of  patient's condition.     Doug Sou, MD 08/19/11 [phone]

## 2011-08-19 NOTE — Progress Notes (Signed)
ANTIBIOTIC CONSULT NOTE - INITIAL  Pharmacy Consult for zosyn Indication: rule out pneumonia  No Known Allergies  Patient Measurements: Height: 5\' 9"  (175.3 cm) Weight: 144 lb 11.2 oz (65.635 kg) IBW/kg (Calculated) : 70.7    Vital Signs: Temp: 98.3 F (36.8 C) (03/24 1914) Temp src: Oral (03/24 1914) BP: 119/63 mmHg (03/24 2000) Pulse Rate: 83  (03/24 2023) Intake/Output from previous day:   Intake/Output from this shift: Total I/O In: 100 [P.O.:100] Out: 500 [Urine:500]  Labs:  [facility] 08/19/11 0712  WBC 4.9  HGB 12.5*  PLT 138*  LABCREA --  CREATININE 0.65   Estimated Creatinine Clearance: 53.5 ml/min (by C-G formula based on Cr of 0.65). No results found for this basename: VANCOTROUGH:2,VANCOPEAK:2,VANCORANDOM:2,GENTTROUGH:2,GENTPEAK:2,GENTRANDOM:2,TOBRATROUGH:2,TOBRAPEAK:2,TOBRARND:2,AMIKACINPEAK:2,AMIKACINTROU:2,AMIKACIN:2, in the last 72 hours   Microbiology: Recent Results (from the past 720 hour(s))  MRSA PCR SCREENING     Status: Normal   Collection Time   08/19/11 12:00 PM      Component Value Range Status Comment   MRSA by PCR NEGATIVE  NEGATIVE  Final     Medical History: Past Medical History  Diagnosis Date  . Myocardial infarct 2003    LAD stent  . Coronary artery disease 2006    re do LAD stent  . Bradycardia 2006    St Jude pacemaker  . BPH (benign prostatic hyperplasia)   . Dysrhythmia   . Pacemaker   . Shortness of breath   . Arthritis     Medications:  Prescriptions prior to admission  Medication Sig Dispense Refill  . acetaminophen (TYLENOL) 325 MG tablet Take 2 tablets (650 mg total) by mouth every 4 (four) hours as needed.      Marland Kitchen amiodarone (PACERONE) 400 MG tablet Take 1 tablet (400 mg total) by mouth 3 (three) times daily.  50 tablet  3  . aspirin 81 MG chewable tablet Chew 81 mg by mouth daily.        . digoxin (LANOXIN) 0.125 MG tablet Take 1 tablet (0.125 mg total) by mouth daily.  30 tablet  5  . diltiazem (CARDIZEM  LA) 120 MG 24 hr tablet Take 1 tablet (120 mg total) by mouth daily.  30 tablet  5  . diphenoxylate-atropine (LOMOTIL) 2.5-0.025 MG per tablet Take 1 tablet by mouth 4 (four) times daily.        Marland Kitchen donepezil (ARICEPT) 10 MG tablet Take 10 mg by mouth at bedtime as needed.        . furosemide (LASIX) 20 MG tablet Take 20 mg by mouth daily.      . isosorbide mononitrate (IMDUR) 15 mg TB24 Take 0.5 tablets (15 mg total) by mouth daily.  30 tablet  5  . metoprolol (LOPRESSOR) 100 MG tablet Take 100 mg by mouth 2 (two) times daily.        . Multiple Vitamins-Minerals (ICAPS PO) Take 1 tablet by mouth daily.        . Multiple Vitamins-Minerals (MULTIVITAMINS THER. W/MINERALS) TABS Take 1 tablet by mouth daily.        . rivaroxaban 20 MG TABS Take 20 mg by mouth daily before supper.  30 tablet  5  . nitroGLYCERIN (NITROSTAT) 0.4 MG SL tablet Place 1 tablet (0.4 mg total) under the tongue every 5 (five) minutes x 3 doses as needed for chest pain.  30 tablet  5   Assessment: 78 yo man to start empiric zosyn for r/o PNA.  Goal of Therapy:  Appropriate antibiotic therapy  Plan:  Zosyn  3.375 gm IV q8 hours; F/u cultures,renal function and clinical course.  Talbert Cage Poteet 08/19/2011,10:07 PM

## 2011-08-20 ENCOUNTER — Inpatient Hospital Stay (HOSPITAL_COMMUNITY): Payer: Medicare Other

## 2011-08-20 DIAGNOSIS — R0902 Hypoxemia: Secondary | ICD-10-CM

## 2011-08-20 DIAGNOSIS — R042 Hemoptysis: Secondary | ICD-10-CM

## 2011-08-20 DIAGNOSIS — J810 Acute pulmonary edema: Secondary | ICD-10-CM

## 2011-08-20 LAB — CBC
HCT: 36.8 % — ABNORMAL LOW (ref 39.0–52.0)
Hemoglobin: 12.3 g/dL — ABNORMAL LOW (ref 13.0–17.0)
MCH: 33.7 pg (ref 26.0–34.0)
MCHC: 33.4 g/dL (ref 30.0–36.0)
MCV: 100.8 fL — ABNORMAL HIGH (ref 78.0–100.0)
Platelets: 136 10*3/uL — ABNORMAL LOW (ref 150–400)
RBC: 3.65 MIL/uL — ABNORMAL LOW (ref 4.22–5.81)
RDW: 13.5 % (ref 11.5–15.5)
WBC: 4.6 10*3/uL (ref 4.0–10.5)

## 2011-08-20 LAB — COMPREHENSIVE METABOLIC PANEL
ALT: 27 U/L (ref 0–53)
AST: 32 U/L (ref 0–37)
Albumin: 3.1 g/dL — ABNORMAL LOW (ref 3.5–5.2)
Alkaline Phosphatase: 48 U/L (ref 39–117)
BUN: 17 mg/dL (ref 6–23)
CO2: 33 mEq/L — ABNORMAL HIGH (ref 19–32)
Calcium: 8.5 mg/dL (ref 8.4–10.5)
Chloride: 97 mEq/L (ref 96–112)
Creatinine, Ser: 0.64 mg/dL (ref 0.50–1.35)
GFR calc Af Amer: >90 mL/min (ref >90)
GFR calc non Af Amer: 82 mL/min — ABNORMAL LOW (ref >90)
Glucose, Bld: 91 mg/dL (ref 70–99)
Potassium: 3 mEq/L — ABNORMAL LOW (ref 3.5–5.1)
Sodium: 139 mEq/L (ref 135–145)
Total Bilirubin: 0.7 mg/dL (ref 0.3–1.2)
Total Protein: 5.6 g/dL — ABNORMAL LOW (ref 6.0–8.3)

## 2011-08-20 LAB — TSH: TSH: 2.694 u[IU]/mL (ref 0.350–4.500)

## 2011-08-20 MED ORDER — POTASSIUM CHLORIDE CRYS ER 20 MEQ PO TBCR
40.0000 meq | EXTENDED_RELEASE_TABLET | Freq: Once | ORAL | Status: AC
  Administered 2011-08-20: 40 meq via ORAL
  Filled 2011-08-20: qty 2

## 2011-08-20 MED ORDER — SODIUM CHLORIDE 0.9 % IV SOLN
INTRAVENOUS | Status: DC | PRN
  Administered 2011-08-20 – 2011-08-24 (×2): via INTRAVENOUS

## 2011-08-20 MED ORDER — PANTOPRAZOLE SODIUM 40 MG PO TBEC
40.0000 mg | DELAYED_RELEASE_TABLET | Freq: Two times a day (BID) | ORAL | Status: DC
  Administered 2011-08-20 – 2011-08-24 (×9): 40 mg via ORAL
  Filled 2011-08-20 (×9): qty 1

## 2011-08-20 MED ORDER — POTASSIUM CHLORIDE CRYS ER 20 MEQ PO TBCR: 40.0000 meq | EXTENDED_RELEASE_TABLET | Freq: Once | ORAL | Status: DC

## 2011-08-20 MED ORDER — AMIODARONE HCL 200 MG PO TABS
400.0000 mg | ORAL_TABLET | Freq: Two times a day (BID) | ORAL | Status: DC
  Administered 2011-08-20 – 2011-08-24 (×8): 400 mg via ORAL
  Filled 2011-08-20 (×9): qty 2

## 2011-08-20 NOTE — Progress Notes (Addendum)
HPI:  78 year old male with PMH of CAD and on anticoagulation who presents to the hospital after noticing 5 episodes of hemoptysis related to cough.  Described only as bloody streaks in sputum.  Since patient was seen on 3/24 he has had no further hemoptysis episodes.  PCCM is called to evaluate hemoptysis.  Antibiotics:   Levofloxacin 3/24>>>  Cultures/Sepsis Markers:   MRSA Screen positive  Access/Protocols:  PIV  Best Practice: DVT: SCD's GI: Protonix  Subjective: No further hemoptysis since xarelto was stopped.  Physical Exam: Filed Vitals:   08/20/11 0808  BP: 125/71  Pulse: 89  Temp: 97.6 F (36.4 C)  Resp:     Intake/Output Summary (Last 24 hours) at 08/20/11 0957 Last data filed at 08/20/11 0800  Gross per 24 hour  Intake  567.5 ml  Output   1550 ml  Net -982.5 ml   Neuro: Alert and oriented, moving all ext to command. Cardiac: IRIR, Nl S1/S2, -M/R/G. Pulmonary: Decrease BS at the RLL with bibasilar crackles. GI: Soft, NT, ND and +BS. Extremities: -edema and -tenderness.  Labs: CBC    Component Value Date/Time   WBC 4.6 08/20/2011 0538   RBC 3.65* 08/20/2011 0538   HGB 12.3* 08/20/2011 0538   HCT 36.8* 08/20/2011 0538   PLT 136* 08/20/2011 0538   MCV 100.8* 08/20/2011 0538   MCH 33.7 08/20/2011 0538   MCHC 33.4 08/20/2011 0538   RDW 13.5 08/20/2011 0538   LYMPHSABS 1.2 08/10/2011 2051   MONOABS 0.6 08/10/2011 2051   EOSABS 0.1 08/10/2011 2051   BASOSABS 0.0 08/10/2011 2051   BMET    Component Value Date/Time   NA 139 08/20/2011 0538   K 3.0* 08/20/2011 0538   CL 97 08/20/2011 0538   CO2 33* 08/20/2011 0538   GLUCOSE 91 08/20/2011 0538   BUN 17 08/20/2011 0538   CREATININE 0.64 08/20/2011 0538   CALCIUM 8.5 08/20/2011 0538   GFRNONAA 82* 08/20/2011 0538   GFRAA >90 08/20/2011 0538   ABG    Component Value Date/Time   PHART 7.406 08/19/2011 0737   PCO2ART 42.4 08/19/2011 0737   PO2ART 56.0* 08/19/2011 0737   HCO3 26.7* 08/19/2011 0737   TCO2 28 08/19/2011 0737    O2SAT 89.0 08/19/2011 0737    No results found for this basename: MG in the last 168 hours Lab Results  Component Value Date   CALCIUM 8.5 08/20/2011    Chest Xray:   Assessment & Plan: 78 year old male with history of chronic bronchitis and chronic cough presenting to the hospital with hemoptysis on anticoagulation for a-fib.  Hemoptysis really is quite minimal and is likely caused by chronic cough that would be attributed to chronic bronchitis due to aspiration or just aspiration.  CXR is suspicious for aspiration PNA but CT showed right sided pleural effusion rather than overt pneumonia.  It does however show thickening of the bronchi on the right suspicious for chronic aspiration.   Plan: - No indication for bronchoscopy at this point. - Would hold anticoagulation for 3 more days, if no further hemoptysis may take another attempt at anticoag is cardiology feels the benefit outweighs the risk.  But would not start it if patient has more hemoptysis. - Agree with zosyn and would treat for a total of 8 days, if ready for d/c may change to Augmentin if no fever after one day of augmentin then may go home with PO for total abx days of 8 days. - No need for  steroids at this time. - Will need a more detailed swallow evaluation. - BID protonix. - Will need PFT's to evaluate for obs lung disease after discharge. - F/U with [facility] 2 wks post discharge. - Replace K. - Will continue to follow with you.  Koren Bound, MD [phone]

## 2011-08-20 NOTE — Evaluation (Signed)
Clinical/Bedside Swallow Evaluation Patient Details  Name: Johnathan [name] MRN: [id number] DOB: [birth year] Today's Date: 08/20/2011  Past Medical History:  Past Medical History  Diagnosis Date  . Myocardial infarct 2003    LAD stent  . Coronary artery disease 2006    re do LAD stent  . Bradycardia 2006    St Jude pacemaker  . BPH (benign prostatic hyperplasia)   . Dysrhythmia   . Pacemaker   . Shortness of breath   . Arthritis    Past Surgical History:  Past Surgical History  Procedure Date  . Pacemaker insertion   . Appendectomy   . Cardiac catheterization   . Insert / replace / remove pacemaker   . Tee without cardioversion 08/15/2011    Procedure: TRANSESOPHAGEAL ECHOCARDIOGRAM (TEE);  Surgeon: [name], MD;  Location: [facility] ENDOSCOPY;  Service: Cardiovascular;  Laterality: N/A;  . Cardioversion 08/15/2011    Procedure: CARDIOVERSION;  Surgeon: [name], MD;  Location: [facility] ENDOSCOPY;  Service: Cardiovascular;  Laterality: N/A;   HPI:  Johnathan [name] is an 78 y.o. male. With PMH relevant for CAD s/p MI treated with PCI, CHF with LVEF 40 to 45 %, bradycardia s/p pacemaker and atrial fibrillation recently started on Xeralto. Recently had a failed TEE (3/20). He presented 08/19/11 complaining of increased SOB and hemoptysis described by the patient as approximately 5 episodes of cough with production of phlegm with bloody streaks. This resolved on its own. CXR 3/24 revealed a suspected right lower lobe PNA. Results of the bedside swallow evaluation indicated subtle s/s of dysphagia, and a MBSS was ordered.   Assessment/Recommendations/Treatment Plan Suspected Esophageal Findings Suspected Esophageal Findings: Belching  SLP Assessment Clinical Impression Statement: Pt presents with subtle s/s of dysphagia characterized by multiple swallows with thin liquids, SOB, and a mild throat clear following cup sips of thin liquid. Given suspected RLL PNA on most recent CXR,  recommend objective testing to further assess his oropharyngeal function. Risk for Aspiration: Mild Other Related Risk Factors: Decreased respiratory status  Swallow Evaluation Recommendations Recommended Consults: MBS Diet Recommendations: Regular;Thin liquid Liquid Administration via: Cup;No straw Medication Administration: Whole meds with puree Supervision: Patient able to self feed;Intermittent supervision to cue for compensatory strategies Compensations: Slow rate;Small sips/bites;Multiple dry swallows after each bite/sip;Hard cough after swallow;Follow solids with liquid (cough after sips of thin liquid) Postural Changes and/or Swallow Maneuvers: Seated upright 90 degrees;Upright 30-60 min after meal Oral Care Recommendations: Oral care BID Follow up Recommendations: None  Treatment Plan Treatment Plan Recommendations: F/U MBS in ___ days (Comment);Therapy as outlined in treatment plan below (MBS 3/25; Tx TBD pending objective testing) Speech Therapy Frequency: min 2x/week  Individuals Consulted Consulted and Agree with Results and Recommendations: Patient;RN Family Member Consulted: caregiver (not related)  Swallowing Goals  TBD pending objective testing  Maxcine Ham 08/20/2011,3:44 PM   Maxcine Ham, SLP Student

## 2011-08-20 NOTE — Progress Notes (Signed)
[facility] history of CAD, PAF/ SSS/ s/p PTVDP.  TEE/CV 3/20 but was unsuccessful.   Subjective: No CP, SOB, hemoptysis, dysphagia.  HE ate breakfast without difficulty.    Objective: Vital signs in last 24 hours: Temp:  [97.5 F (36.4 C)-98.7 F (37.1 C)] 97.6 F (36.4 C) (03/25 0808) Pulse Rate:  [69-89] 89  (03/25 0808) Resp:  [16-26] 18  (03/25 0300) BP: (107-157)/(56-104) 125/71 mmHg (03/25 0808) SpO2:  [92 %-100 %] 98 % (03/25 0808) Weight:  [65.635 kg (144 lb 11.2 oz)] 65.635 kg (144 lb 11.2 oz) (03/24 1500) Last BM Date: 08/19/11  Intake/Output from previous day: 03/24 0701 - 03/25 0700 In: 435 [P.O.:160; IV Piggyback:275] Out: 1550 [Urine:1550] Intake/Output this shift: Total I/O In: 132.5 [P.O.:120; IV Piggyback:12.5] Out: -   Medications Current Facility-Administered Medications  Medication Dose Route Frequency Provider Last Rate Last Dose  . acetaminophen (TYLENOL) tablet 650 mg  650 mg Oral Q4H PRN Abelino Derrick, PA      . ALPRAZolam Prudy Feeler) tablet 0.25 mg  0.25 mg Oral BID PRN Abelino Derrick, PA      . amiodarone (PACERONE) tablet 400 mg  400 mg Oral Daily Eda Paschal [place], [place]   400 mg at 08/19/11 1401  . aspirin chewable tablet 81 mg  81 mg Oral Daily Eda Paschal [place], [place]   81 mg at 08/19/11 1401  . digoxin (LANOXIN) tablet 0.125 mg  0.125 mg Oral Daily Eda Paschal [place], PA   0.125 mg at 08/19/11 1402  . diltiazem (CARDIZEM LA) 24 hr tablet 120 mg  120 mg Oral Daily Eda Paschal [place], [place]   120 mg at 08/19/11 1401  . diphenoxylate-atropine (LOMOTIL) 2.5-0.025 MG per tablet 1 tablet  1 tablet Oral QID PRN Marykay Lex, MD      . donepezil (ARICEPT) tablet 10 mg  10 mg Oral QHS Eda Paschal [place], [place]   10 mg at 08/19/11 2138  . famotidine (PEPCID) IVPB 20 mg  20 mg Intravenous Q12H Marykay Lex, MD   20 mg at 08/19/11 2244  . furosemide (LASIX) injection 40 mg  40 mg Intravenous Once Abelino Derrick, [place]   40 mg at 08/19/11 1050  . furosemide  (LASIX) tablet 40 mg  40 mg Oral BID Marykay Lex, MD   40 mg at 08/20/11 1610  . guaiFENesin-dextromethorphan (ROBITUSSIN DM) 100-10 MG/5ML syrup 5 mL  5 mL Oral Q4H PRN Marykay Lex, MD      . isosorbide mononitrate (IMDUR) 24 hr tablet 30 mg  30 mg Oral Daily Marykay Lex, MD      . levalbuterol [facility]) nebulizer solution 0.63 mg  0.63 mg Nebulization TID Marykay Lex, MD   0.63 mg at 08/20/11 0730  . metoprolol (LOPRESSOR) tablet 100 mg  100 mg Oral BID Eda Paschal [place], [place]   100 mg at 08/19/11 2138  . mulitivitamin with minerals tablet 1 tablet  1 tablet Oral Daily Runell Gess, MD   1 tablet at 08/19/11 1402  . nitroGLYCERIN (NITROSTAT) SL tablet 0.4 mg  0.4 mg Sublingual Q5 Min x 3 PRN Abelino Derrick, PA      . piperacillin-tazobactam (ZOSYN) IVPB 3.375 g  3.375 g Intravenous Q8H Runell Gess, MD   3.375 g at 08/20/11 9604  . potassium chloride SA (K-DUR,KLOR-CON) CR tablet 40 mEq  40 mEq Oral Once Dwana Melena, PA   40 mEq at 08/20/11 0814  .  DISCONTD: diphenoxylate-atropine (LOMOTIL) 2.5-0.025 MG per tablet 1 tablet  1 tablet Oral QID Abelino Derrick, [place]      . DISCONTD: furosemide (LASIX) tablet 20 mg  20 mg Oral Daily Eda Paschal [place], [place]      . DISCONTD: isosorbide mononitrate (IMDUR) 24 hr tablet 15 mg  15 mg Oral Daily Eda Paschal [place], [place]   15 mg at 08/19/11 1402  . DISCONTD: Levofloxacin (LEVAQUIN) IVPB 750 mg  750 mg Intravenous Q24H Eda Paschal [place], PA   750 mg at 08/19/11 1053  . DISCONTD: multivitamins ther. w/minerals tablet 1 tablet  1 tablet Oral Daily Abelino Derrick, [place]        PE: General appearance: alert, cooperative and no distress Lungs: Decreased BS. +bilateral wheeze Heart: irregularly irregular rhythm Extremities: No LEE Pulses: 2+ and symmetric  Lab Results:   Basename 08/20/11 0538 08/19/11 0712  WBC 4.6 4.9  HGB 12.3* 12.5*  HCT 36.8* 36.6*  PLT 136* 138*   BMET  Basename 08/20/11 0538 08/19/11 0712  NA 139 137  K 3.0* 3.6  CL 97 99  CO2  33* 26  GLUCOSE 91 106*  BUN 17 21  CREATININE 0.64 0.65  CALCIUM 8.5 8.6   PT/INR No results found for this basename: LABPROT:3,INR:3 in the last 72 hours Cholesterol No results found for this basename: CHOL in the last 72 hours Cardiac Enzymes No components found with this basename: TROPONIN:3, CKMB:3  Studies/Results: 08/20/11, CT CHEST WITHOUT CONTRAST  Technique: Multidetector CT imaging of the chest was performed  following the standard protocol without IV contrast.  Comparison: 08/19/2011 and prior chest radiographs  Findings: Cardiomegaly, heavy coronary artery calcifications and a  right-sided pacemaker are identified.  Bilateral pleural effusions are identified, small to moderate on  the right and very small on the left.  There is no evidence of pericardial effusion or enlarged lymph  nodes.  Moderate centrilobular and paraseptal emphysema are noted.  Right basilar atelectasis is present.  There is no evidence of airspace disease, consolidation, nodule,  discrete mass or endobronchial/endotracheal lesions.  No acute or suspicious bony abnormalities are identified.  The visualized upper abdomen is within normal limits.  IMPRESSION:  Bilateral pleural effusions, small to moderate on the right and  very small on the left, with associated right basilar atelectasis.  Moderate centrilobular and paraseptal emphysema.  Cardiomegaly.  08/20/11, PORTABLE CHEST - 1 VIEW  Comparison: 08/19/2011  Findings: 0516 hours. The cardiopericardial silhouette is enlarged.  Slight improvement in the right base airspace disease. No  substantial pleural effusion. No pulmonary edema. Right permanent  pacemaker remains in place. Telemetry leads overlie the chest.  IMPRESSION:  Cardiomegaly with slight improvement in airspace opacity at the  right base.  Assessment/Plan    Principal Problem:  *Respiratory failure Active Problems:  Atrial fibrillation, s/p recent failed TEE/CV, on AMIO  and Xarelto  CAD, LAD stent 2003, 2006, patent with distal disease this admission  HTN (hypertension)  Pacemaker, St Jude 2006  Cardiomyopathy, EF 45%  Possible: Hemoptysis,  from Xarelto   Hypoxia, PO2 50s  Plan:  CT Chest negative for malignancy.  Being treated with Abx.  Xarelto on hold.  Afib rate controlled.  Lopressor /Digoxin/ Diltiazem/ASA/AMIO.  Net fluids-nega yesterday.  Lasix at 40mg  PO BID.  Hgb stable.  No further hemoptysis.  Repleting K+.  AM labs.  DCCV?  Restart Xarelto?   LOS: 1 day    Cristen Bredeson W 08/20/2011 8:54 AM

## 2011-08-20 NOTE — Progress Notes (Signed)
Pt. Seen and examined. Agree with the NP/PA-C note as written.  This appears to be pneumonia, likely aspiration vs. CAP. He was coughing prior to the TEE last week, so I suspect he was developing a lung infection at that time. He has had no further hemoptysis. I would consider restarting xarelto at discharge - possible at the 15 mg dose. He was never fully loaded with amiodarone. I would go back to BID dosing for 2 weeks and then decrease to once daily. Plan still to consider cardioversion in 1 month. Would either need 1 interrupted month of xarelto or repeat TEE.  [name], MD, [facility] Attending Cardiologist [facility]

## 2011-08-20 NOTE — Procedures (Signed)
Objective Swallowing Evaluation: Modified Barium Swallowing Study  Patient Details  Name: Johnathan [name] MRN: [id number] Date of Birth: 1915/09/21  Today's Date: 08/20/2011 Time:  -     Past Medical History:  Past Medical History  Diagnosis Date  . Myocardial infarct 2003    LAD stent  . Coronary artery disease 2006    re do LAD stent  . Bradycardia 2006    St Jude pacemaker  . BPH (benign prostatic hyperplasia)   . Dysrhythmia   . Pacemaker   . Shortness of breath   . Arthritis    Past Surgical History:  Past Surgical History  Procedure Date  . Pacemaker insertion   . Appendectomy   . Cardiac catheterization   . Insert / replace / remove pacemaker   . Tee without cardioversion 08/15/2011    Procedure: TRANSESOPHAGEAL ECHOCARDIOGRAM (TEE);  Surgeon: [name], MD;  Location: [facility] ENDOSCOPY;  Service: Cardiovascular;  Laterality: N/A;  . Cardioversion 08/15/2011    Procedure: CARDIOVERSION;  Surgeon: [name], MD;  Location: [facility] ENDOSCOPY;  Service: Cardiovascular;  Laterality: N/A;   HPI:  Johnathan [name] is an 78 y.o. male. With PMH relevant for CAD s/p MI treated with PCI, CHF with LVEF 40 to 45 %, bradycardia s/p pacemaker and atrial fibrillation recently started on Xeralto. Recently had a failed TEE (3/20). He presented 08/19/11 complaining of increased SOB and hemoptysis described by the patient as approximately 5 episodes of cough with production of phlegm with bloody streaks. This resolved on its own. CXR 3/24 revealed a suspected right lower lobe PNA. Results of the bedside swallow evaluation indicated subtle s/s of dysphagia, and a MBSS was ordered.   Recommendation/Prognosis  Clinical Impression Dysphagia Diagnosis: Mild pharyngeal phase dysphagia;Mild cervical esophageal phase dysphagia Clinical impression: Pt presents with a mild pharyngeal and cervical esophageal dysphagia. The pharyngeal phase was characterized by a delay in swallow initiation to the  valleculae and decreased laryngeal closure, resulting in trace penetration of thin liquid during the swallow. Pharyngeal residuals at the valleculae and CP segment post swallow may be indicative of a high pharyngeal pressure secondary to a possible esophageal component, and decreased relaxation of the UES may likely be due to suspected osteophytes C5-6, although MD was not present to confirm these findings. Spontaneous multiple swallows and intermittent throat clear were effective at clearing all penetrates as well as decreasing pharyngeal residuals. No aspiration was observed. A chin tuck was not effective at decreasing residuals or further protecting the airway. Despite a heightened aspiration risk given the above, pt appears to be adequately protecting his airway at this time. Recommend to continue a regular diet with thin liquids, with multiple swallows and a strong cough after sips of thin liquid.  Swallow Evaluation Recommendations Diet Recommendations: Regular;Thin liquid Liquid Administration via: Cup;No straw Medication Administration: Whole meds with puree Supervision: Patient able to self feed;Intermittent supervision to cue for compensatory strategies Compensations: Slow rate;Small sips/bites;Multiple dry swallows after each bite/sip;Hard cough after swallow;Follow solids with liquid (cough after sips of thin liquid) Postural Changes and/or Swallow Maneuvers: Seated upright 90 degrees;Upright 30-60 min after meal Oral Care Recommendations: Oral care BID Follow up Recommendations: None   Individuals Consulted Consulted and Agree with Results and Recommendations: Patient;RN  SLP Goals  SLP Swallowing Goals Patient will consume recommended diet without observed clinical signs of aspiration with: Modified independent assistance Swallow Study Goal #1 - Progress: Not Met Patient will utilize recommended strategies during swallow to increase swallowing safety with:  Modified independent  assistance Swallow Study Goal #2 - Progress: Not met  Maxcine Ham 08/20/2011, 3:51 PM   Maxcine Ham, SLP Student

## 2011-08-21 DIAGNOSIS — R042 Hemoptysis: Secondary | ICD-10-CM

## 2011-08-21 DIAGNOSIS — R0902 Hypoxemia: Secondary | ICD-10-CM

## 2011-08-21 DIAGNOSIS — J810 Acute pulmonary edema: Secondary | ICD-10-CM

## 2011-08-21 LAB — GLUCOSE, CAPILLARY: Glucose-Capillary: 122 mg/dL — ABNORMAL HIGH (ref 70–99)

## 2011-08-21 LAB — BASIC METABOLIC PANEL
BUN: 14 mg/dL (ref 6–23)
Chloride: 96 mEq/L (ref 96–112)
GFR calc Af Amer: 88 mL/min — ABNORMAL LOW (ref >90)
GFR calc non Af Amer: 76 mL/min — ABNORMAL LOW (ref >90)
Potassium: 3.1 mEq/L — ABNORMAL LOW (ref 3.5–5.1)
Sodium: 139 mEq/L (ref 135–145)

## 2011-08-21 LAB — CBC
HCT: 36.6 % — ABNORMAL LOW (ref 39.0–52.0)
Hemoglobin: 12.1 g/dL — ABNORMAL LOW (ref 13.0–17.0)
MCHC: 33.1 g/dL (ref 30.0–36.0)
RBC: 3.62 MIL/uL — ABNORMAL LOW (ref 4.22–5.81)
WBC: 3.9 10*3/uL — ABNORMAL LOW (ref 4.0–10.5)

## 2011-08-21 LAB — POTASSIUM: Potassium: 3.5 mEq/L (ref 3.5–5.1)

## 2011-08-21 MED ORDER — POTASSIUM CHLORIDE CRYS ER 20 MEQ PO TBCR: 40.0000 meq | EXTENDED_RELEASE_TABLET | Freq: Once | ORAL | Status: DC

## 2011-08-21 MED ORDER — WHITE PETROLATUM GEL
Status: AC
  Administered 2011-08-21: 07:00:00
  Filled 2011-08-21: qty 5

## 2011-08-21 MED ORDER — POTASSIUM CHLORIDE CRYS ER 20 MEQ PO TBCR
40.0000 meq | EXTENDED_RELEASE_TABLET | Freq: Once | ORAL | Status: AC
  Administered 2011-08-21: 40 meq via ORAL
  Filled 2011-08-21: qty 2

## 2011-08-21 MED ORDER — LEVALBUTEROL HCL 0.63 MG/3ML IN NEBU
0.6300 mg | INHALATION_SOLUTION | Freq: Four times a day (QID) | RESPIRATORY_TRACT | Status: DC | PRN
  Filled 2011-08-21 (×2): qty 3

## 2011-08-21 MED ORDER — FUROSEMIDE 40 MG PO TABS
40.0000 mg | ORAL_TABLET | Freq: Every day | ORAL | Status: DC
  Administered 2011-08-22 – 2011-08-24 (×3): 40 mg via ORAL
  Filled 2011-08-21 (×3): qty 1

## 2011-08-21 MED ORDER — POTASSIUM CHLORIDE CRYS ER 10 MEQ PO TBCR
10.0000 meq | EXTENDED_RELEASE_TABLET | Freq: Every day | ORAL | Status: DC
  Administered 2011-08-22 – 2011-08-24 (×3): 10 meq via ORAL
  Filled 2011-08-21 (×3): qty 1

## 2011-08-21 MED ORDER — FAMOTIDINE 20 MG PO TABS
20.0000 mg | ORAL_TABLET | Freq: Two times a day (BID) | ORAL | Status: DC
  Administered 2011-08-21 – 2011-08-23 (×5): 20 mg via ORAL
  Filled 2011-08-21 (×7): qty 1

## 2011-08-21 NOTE — Progress Notes (Signed)
UR Completed. Simmons, Dawsyn Zurn F [phone]  

## 2011-08-21 NOTE — Progress Notes (Signed)
[facility]  DAILY [facility]GRESS NOTE   Subjective:  No hemoptysis overnight. Passed swallow evaluation. Still coughing. He is manifesting some ICU delerium.  Objective:  Temp:  [97.4 F (36.3 C)-97.9 F (36.6 C)] 97.9 F (36.6 C) (03/26 0816) Pulse Rate:  [80-93] 87  (03/26 0400) Resp:  [15-23] 23  (03/26 0400) BP: (94-127)/(57-86) 125/82 mmHg (03/26 0400) SpO2:  [95 %-100 %] 95 % (03/26 0818) Weight change:   Intake/Output from previous day: 03/25 0701 - 03/26 0700 In: 1155 [P.O.:860; I.V.:45; IV Piggyback:250] Out: 1300 [Urine:1300]  Intake/Output from this shift:    Medications: Current Facility-Administered Medications  Medication Dose Route Frequency Provider Last Rate Last Dose  . 0.9 %  sodium chloride infusion   Intravenous Continuous PRN Runell Gess, MD      . acetaminophen (TYLENOL) tablet 650 mg  650 mg Oral Q4H PRN Abelino Derrick, PA      . ALPRAZolam Prudy Feeler) tablet 0.25 mg  0.25 mg Oral BID PRN Abelino Derrick, PA      . amiodarone (PACERONE) tablet 400 mg  400 mg Oral BID [name], MD   400 mg at 08/20/11 2117  . aspirin chewable tablet 81 mg  81 mg Oral Daily Eda Paschal [place], [place]   81 mg at 08/20/11 1036  . digoxin (LANOXIN) tablet 0.125 mg  0.125 mg Oral Daily Eda Paschal [place], PA   0.125 mg at 08/20/11 1036  . diltiazem (CARDIZEM LA) 24 hr tablet 120 mg  120 mg Oral Daily Eda Paschal [place], [place]   120 mg at 08/20/11 1036  . diphenoxylate-atropine (LOMOTIL) 2.5-0.025 MG per tablet 1 tablet  1 tablet Oral QID PRN Marykay Lex, MD   1 tablet at 08/21/11 0755  . donepezil (ARICEPT) tablet 10 mg  10 mg Oral QHS Eda Paschal [place], [place]   10 mg at 08/20/11 2117  . famotidine (PEPCID) tablet 20 mg  20 mg Oral BID Runell Gess, MD      . furosemide (LASIX) tablet 40 mg  40 mg Oral BID Marykay Lex, MD   40 mg at 08/21/11 0755  . guaiFENesin-dextromethorphan (ROBITUSSIN DM) 100-10 MG/5ML syrup 5 mL  5 mL Oral Q4H PRN Marykay Lex, MD        . isosorbide mononitrate (IMDUR) 24 hr tablet 30 mg  30 mg Oral Daily Marykay Lex, MD   30 mg at 08/20/11 1036  . levalbuterol (XOPENEX) nebulizer solution 0.63 mg  0.63 mg Nebulization TID Marykay Lex, MD   0.63 mg at 08/21/11 0817  . metoprolol (LOPRESSOR) tablet 100 mg  100 mg Oral BID Eda Paschal [place], [place]   100 mg at 08/20/11 2118  . mulitivitamin with minerals tablet 1 tablet  1 tablet Oral Daily Runell Gess, MD   1 tablet at 08/20/11 1036  . nitroGLYCERIN (NITROSTAT) SL tablet 0.4 mg  0.4 mg Sublingual Q5 Min x 3 PRN Abelino Derrick, PA      . pantoprazole (PROTONIX) EC tablet 40 mg  40 mg Oral BID WC Alyson Reedy, MD   40 mg at 08/21/11 0755  . piperacillin-tazobactam (ZOSYN) IVPB 3.375 g  3.375 g Intravenous Q8H Runell Gess, MD   3.375 g at 08/21/11 0600  . potassium chloride SA (K-DUR,KLOR-CON) CR tablet 40 mEq  40 mEq Oral Once [name], MD   40 mEq at 08/20/11 1042  . white petrolatum (VASELINE) gel           .  DISCONTD: amiodarone (PACERONE) tablet 400 mg  400 mg Oral Daily Eda Paschal [place], [place]   400 mg at 08/19/11 1401  . DISCONTD: famotidine (PEPCID) IVPB 20 mg  20 mg Intravenous Q12H Marykay Lex, MD   20 mg at 08/20/11 2117  . DISCONTD: potassium chloride SA (K-DUR,KLOR-CON) CR tablet 40 mEq  40 mEq Oral Once Dwana Melena, PA        Physical Exam: General appearance: alert, confused Neck: no adenopathy, no carotid bruit, no JVD, supple, symmetrical, trachea midline and thyroid not enlarged, symmetric, no tenderness/mass/nodules Lungs: rhonchi bilaterally and wheezes bilaterally Heart: irregularly irregular, S1, S2 normal Abdomen: soft, non-tender; bowel sounds normal; no masses,  no organomegaly Extremities: extremities normal, atraumatic, no cyanosis or edema Pulses: 2+ and symmetric  Lab Results: Results for orders placed during the hospital encounter of 08/19/11 (from the past 48 hour(s))  PRO B NATRIURETIC PEPTIDE     Status: Abnormal    Collection Time   08/19/11  8:44 AM      Component Value Range Comment   Pro B Natriuretic peptide (BNP) 5714.0 (*) 0 - 450 (pg/mL)   MRSA PCR SCREENING     Status: Normal   Collection Time   08/19/11 12:00 PM      Component Value Range Comment   MRSA by PCR NEGATIVE  NEGATIVE    CBC     Status: Abnormal   Collection Time   08/20/11  5:38 AM      Component Value Range Comment   WBC 4.6  4.0 - 10.5 (K/uL)    RBC 3.65 (*) 4.22 - 5.81 (MIL/uL)    Hemoglobin 12.3 (*) 13.0 - 17.0 (g/dL)    HCT 16.6 (*) 06.3 - 52.0 (%)    MCV 100.8 (*) 78.0 - 100.0 (fL)    MCH 33.7  26.0 - 34.0 (pg)    MCHC 33.4  30.0 - 36.0 (g/dL)    RDW 01.6  01.0 - 93.2 (%)    Platelets 136 (*) 150 - 400 (K/uL)   COMPREHENSIVE METABOLIC PANEL     Status: Abnormal   Collection Time   08/20/11  5:38 AM      Component Value Range Comment   Sodium 139  135 - 145 (mEq/L)    Potassium 3.0 (*) 3.5 - 5.1 (mEq/L)    Chloride 97  96 - 112 (mEq/L)    CO2 33 (*) 19 - 32 (mEq/L)    Glucose, Bld 91  70 - 99 (mg/dL)    BUN 17  6 - 23 (mg/dL)    Creatinine, Ser 3.55  0.50 - 1.35 (mg/dL)    Calcium 8.5  8.4 - 10.5 (mg/dL)    Total Protein 5.6 (*) 6.0 - 8.3 (g/dL)    Albumin 3.1 (*) 3.5 - 5.2 (g/dL)    AST 32  0 - 37 (U/L)    ALT 27  0 - 53 (U/L)    Alkaline Phosphatase 48  39 - 117 (U/L)    Total Bilirubin 0.7  0.3 - 1.2 (mg/dL)    GFR calc non Af Amer 82 (*) >90 (mL/min)    GFR calc Af Amer >90  >90 (mL/min)   TSH     Status: Normal   Collection Time   08/20/11  5:38 AM      Component Value Range Comment   TSH 2.694  0.350 - 4.500 (uIU/mL)   CLOSTRIDIUM DIFFICILE BY PCR     Status: Normal   Collection Time  08/20/11  3:11 PM      Component Value Range Comment   C difficile by pcr NEGATIVE  NEGATIVE    BASIC METABOLIC PANEL     Status: Abnormal   Collection Time   08/21/11  5:48 AM      Component Value Range Comment   Sodium 139  135 - 145 (mEq/L)    Potassium 3.1 (*) 3.5 - 5.1 (mEq/L)    Chloride 96  96 - 112 (mEq/L)     CO2 34 (*) 19 - 32 (mEq/L)    Glucose, Bld 91  70 - 99 (mg/dL)    BUN 14  6 - 23 (mg/dL)    Creatinine, Ser 4.13  0.50 - 1.35 (mg/dL)    Calcium 8.5  8.4 - 10.5 (mg/dL)    GFR calc non Af Amer 76 (*) >90 (mL/min)    GFR calc Af Amer 88 (*) >90 (mL/min)   CBC     Status: Abnormal   Collection Time   08/21/11  5:48 AM      Component Value Range Comment   WBC 3.9 (*) 4.0 - 10.5 (K/uL)    RBC 3.62 (*) 4.22 - 5.81 (MIL/uL)    Hemoglobin 12.1 (*) 13.0 - 17.0 (g/dL)    HCT 24.4 (*) 01.0 - 52.0 (%)    MCV 101.1 (*) 78.0 - 100.0 (fL)    MCH 33.4  26.0 - 34.0 (pg)    MCHC 33.1  30.0 - 36.0 (g/dL)    RDW 27.2  53.6 - 64.4 (%)    Platelets 137 (*) 150 - 400 (K/uL)     Imaging: Ct Chest Wo Contrast  08/20/2011  *RADIOLOGY REPORT*  Clinical Data: 78 year old male with hemoptysis and shortness of breath.  CT CHEST WITHOUT CONTRAST  Technique:  Multidetector CT imaging of the chest was performed following the standard protocol without IV contrast.  Comparison: 08/19/2011 and prior chest radiographs  Findings: Cardiomegaly, heavy coronary artery calcifications and a right-sided pacemaker are identified.  Bilateral pleural effusions are identified, small to moderate on the right and very small on the left. There is no evidence of pericardial effusion or enlarged lymph nodes.  Moderate centrilobular and paraseptal emphysema are noted. Right basilar atelectasis is present. There is no evidence of airspace disease, consolidation, nodule, discrete mass or endobronchial/endotracheal lesions.  No acute or suspicious bony abnormalities are identified. The visualized upper abdomen is within normal limits.  IMPRESSION: Bilateral pleural effusions, small to moderate on the right and very small on the left, with associated right basilar atelectasis.  Moderate centrilobular and paraseptal emphysema.  Cardiomegaly.  Original Report Authenticated By: Rosendo Gros, M.D.   Dg Chest Port 1 View  08/20/2011  *RADIOLOGY  REPORT*  Clinical Data: Shortness of breath and cough.  PORTABLE CHEST - 1 VIEW  Comparison: 08/19/2011  Findings: 0516 hours. The cardiopericardial silhouette is enlarged. Slight improvement in the right base airspace disease.  No substantial pleural effusion.  No pulmonary edema.  Right permanent pacemaker remains in place. Telemetry leads overlie the chest.  IMPRESSION: Cardiomegaly with slight improvement in airspace opacity at the right base.  Original Report Authenticated By: ERIC A. MANSELL, M.D.    Assessment:  1. Principal Problem: 2.  *Respiratory failure 3. Active Problems: 4.  Atrial fibrillation, s/p recent failed TEE/CV, on AMIO and Xarelto 5.  CAD, LAD stent 2003, 2006, patent with distal disease this admission 6.  HTN (hypertension) 7.  Pacemaker, St Jude 2006 8.  Cardiomyopathy, EF 45%  9.  Possible: Hemoptysis,  from Xarelto  10.  Hypoxia, PO2 50s 11.   Plan:  1. Patient and family want to go home. Could possibly treat with oral augmentin. Would restart reduced dose xarelto in 3 days (15 mg daily).  He is delirious today, I'm not sure he is ready for discharge yet. There do not appear to be any active cardiac issues. I would restart maintenance lasix 40 mg daily tomorrow.  Time Spent Directly with Patient:  15 minutes  Length of Stay:  LOS: 2 days   [name], MD, [facility] Attending Cardiologist [facility]  Tomie Spizzirri C 08/21/2011, 8:37 AM

## 2011-08-21 NOTE — Progress Notes (Signed)
UR Completed. Simmons, Neta Upadhyay F [phone]  

## 2011-08-21 NOTE — Progress Notes (Signed)
Speech Language Pathology Dysphagia Treatment  Patient Details Name: Johnathan [name] MRN: [id number] DOB: August 07, 1915 Today's Date: 08/21/2011  SLP Assessment/Plan/Recommendation Assessment / Recommendations / Plan Clinical Impression Statement: Pt presents with increased s/s of a suspected esophageal component today, with episodes of belching following each cup sip of thin liquid. Pt would cough following episodes of belching, however pt with increased cough at baseline today per visitors, who did not feel as though his cough increased with PO intake. Results of MBSS 3/25 revealed penetration with thin liquids which were cleared spontaneously each time, as well as decreased relaxation of the UES with suspected osteophytes. No acute events occurred overnight to suspect a change in swallow function at this time. SLP to f/u closely to continue to assess diet tolerance and aspiration risk. Plan: Continue with current plan of care Swallowing Goals  SLP Swallowing Goals Patient will consume recommended diet without observed clinical signs of aspiration with: Modified independent assistance Swallow Study Goal #1 - Progress: Progressing toward goal Patient will utilize recommended strategies during swallow to increase swallowing safety with: Modified independent assistance Swallow Study Goal #2 - Progress: Progressing toward goal  General Temperature Spikes Noted: No Respiratory Status: Supplemental O2 delivered via (comment) (Nasal cannula; 2.5L) Behavior/Cognition: Alert;Cooperative;Pleasant mood Oral Cavity - Dentition: Adequate natural dentition Patient Positioning: Upright in bed  Oral Cavity - Oral Hygiene Does patient have any of the following "at risk" factors?: Oxygen therapy - cannula, mask, simple oxygen devices Brush patient's teeth BID with toothbrush (using toothpaste with fluoride): Yes Patient is AT RISK - Oral Care Protocol followed (see row info): Yes   Dysphagia  Treatment Treatment focused on: Skilled observation of diet tolerance;Patient/family/caregiver education;Utilization of compensatory strategies Family/Caregiver Educated: daughter, caregiver (unrelated) Treatment Methods/Modalities: Skilled observation;Differential diagnosis;Other (comment) (multiple swallows, cough post swallow) Patient observed directly with PO's: Yes Type of PO's observed: Dysphagia 3 (soft);Thin liquids Feeding: Able to feed self Liquids provided via: Cup;No straw Pharyngeal Phase Signs & Symptoms: Delayed cough;Other (comment) (belching followed by cough after cup sips of thin liquid) Type of cueing: Verbal;Visual Amount of cueing: Moderate   Maxcine Ham 08/21/2011, 4:39 PM  Maxcine Ham, SLP Student

## 2011-08-21 NOTE — Progress Notes (Signed)
HPI:  78 year old male with PMH of CAD and on anticoagulation who presents to the hospital after noticing 5 episodes of hemoptysis related to cough.  Described only as bloody streaks in sputum.  Since patient was seen on 3/24 he has had no further hemoptysis episodes.  PCCM is called to evaluate hemoptysis.  Antibiotics:   Levofloxacin 3/24>>>  Cultures/Sepsis Markers:   MRSA Screen positive  Access/Protocols:  PIV  Best Practice: DVT: SCD's GI: Protonix  Subjective: No further hemoptysis since xarelto was stopped.  Physical Exam: Filed Vitals:   08/21/11 0816  BP:   Pulse:   Temp: 97.9 F (36.6 C)  Resp:     Intake/Output Summary (Last 24 hours) at 08/21/11 1026 Last data filed at 08/21/11 0800  Gross per 24 hour  Intake  782.5 ml  Output   1300 ml  Net -517.5 ml   Neuro: Alert and oriented, moving all ext to command. Cardiac: IRIR, Nl S1/S2, -M/R/G. Pulmonary: Decrease BS at the RLL with bibasilar crackles. GI: Soft, NT, ND and +BS. Extremities: -edema and -tenderness.  Labs: CBC    Component Value Date/Time   WBC 3.9* 08/21/2011 0548   RBC 3.62* 08/21/2011 0548   HGB 12.1* 08/21/2011 0548   HCT 36.6* 08/21/2011 0548   PLT 137* 08/21/2011 0548   MCV 101.1* 08/21/2011 0548   MCH 33.4 08/21/2011 0548   MCHC 33.1 08/21/2011 0548   RDW 13.4 08/21/2011 0548   LYMPHSABS 1.2 08/10/2011 2051   MONOABS 0.6 08/10/2011 2051   EOSABS 0.1 08/10/2011 2051   BASOSABS 0.0 08/10/2011 2051   BMET    Component Value Date/Time   NA 139 08/21/2011 0548   K 3.1* 08/21/2011 0548   CL 96 08/21/2011 0548   CO2 34* 08/21/2011 0548   GLUCOSE 91 08/21/2011 0548   BUN 14 08/21/2011 0548   CREATININE 0.76 08/21/2011 0548   CALCIUM 8.5 08/21/2011 0548   GFRNONAA 76* 08/21/2011 0548   GFRAA 88* 08/21/2011 0548   ABG    Component Value Date/Time   PHART 7.406 08/19/2011 0737   PCO2ART 42.4 08/19/2011 0737   PO2ART 56.0* 08/19/2011 0737   HCO3 26.7* 08/19/2011 0737   TCO2 28 08/19/2011 0737   O2SAT 89.0 08/19/2011 0737    No results found for this basename: MG in the last 168 hours Lab Results  Component Value Date   CALCIUM 8.5 08/21/2011    Chest Xray:   Assessment & Plan: 78 year old male with history of chronic bronchitis and chronic cough presenting to the hospital with hemoptysis on anticoagulation for a-fib.  Hemoptysis really is quite minimal and is likely caused by chronic cough that would be attributed to chronic bronchitis due to aspiration or just aspiration.  CXR is suspicious for aspiration PNA but CT showed right sided pleural effusion rather than overt pneumonia.  It does however show thickening of the bronchi on the right suspicious for chronic aspiration.   Plan: - No indication for bronchoscopy at this point. - No further hemoptysis, may restart anticoag by tomorrow if no further hemoptysis is noted. - Would change zosyn to PO Augmentin at this point and if not fever overnight would be ready for discharge with augmentin for total abx days of 8 days. - No need for steroids at this time. - PFTs with evidence of restriction, would recommend f/u with PCCM as outpatient 2 wks after discharge for some lung volume. - BID protonix. - Replace K. - PCCM signing off, please  call back if needed.  Koren Bound, MD [phone]

## 2011-08-22 ENCOUNTER — Other Ambulatory Visit: Payer: Self-pay

## 2011-08-22 LAB — CBC
Hemoglobin: 12.6 g/dL — ABNORMAL LOW (ref 13.0–17.0)
MCH: 33.9 pg (ref 26.0–34.0)
RBC: 3.72 MIL/uL — ABNORMAL LOW (ref 4.22–5.81)
WBC: 3.6 10*3/uL — ABNORMAL LOW (ref 4.0–10.5)

## 2011-08-22 LAB — BASIC METABOLIC PANEL
CO2: 36 mEq/L — ABNORMAL HIGH (ref 19–32)
GFR calc non Af Amer: 77 mL/min — ABNORMAL LOW (ref >90)
Glucose, Bld: 96 mg/dL (ref 70–99)
Potassium: 3.5 mEq/L (ref 3.5–5.1)
Sodium: 141 mEq/L (ref 135–145)

## 2011-08-22 MED ORDER — AMOXICILLIN-POT CLAVULANATE 500-125 MG PO TABS
1.0000 | ORAL_TABLET | Freq: Three times a day (TID) | ORAL | Status: DC
  Administered 2011-08-22 – 2011-08-24 (×8): 500 mg via ORAL
  Filled 2011-08-22 (×10): qty 1

## 2011-08-22 NOTE — Evaluation (Signed)
Physical Therapy Evaluation Patient Details Name: Johnathan [name] MRN: [id number] DOB: 22-Mar-1916 Today's Date: 08/22/2011  Problem List:  Patient Active Problem List  Diagnoses  . Atrial fibrillation, s/p recent failed TEE/CV, on AMIO and Xarelto  . Troponin level elevated, 0.74  . CAD, LAD stent 2003, 2006, patent with distal disease this admission  . HTN (hypertension)  . Pacemaker, St Jude 2006  . BPH (benign prostatic hyperplasia)  . Cardiomyopathy, EF 45%  . Possible: Hemoptysis,  from Xarelto   . Hypoxia, PO2 50s  . Respiratory failure    Past Medical History:  Past Medical History  Diagnosis Date  . Myocardial infarct 2003    LAD stent  . Coronary artery disease 2006    re do LAD stent  . Bradycardia 2006    St Jude pacemaker  . BPH (benign prostatic hyperplasia)   . Dysrhythmia   . Pacemaker   . Shortness of breath   . Arthritis    Past Surgical History:  Past Surgical History  Procedure Date  . Pacemaker insertion   . Appendectomy   . Cardiac catheterization   . Insert / replace / remove pacemaker   . Tee without cardioversion 08/15/2011    Procedure: TRANSESOPHAGEAL ECHOCARDIOGRAM (TEE);  Surgeon: [name], MD;  Location: [facility] ENDOSCOPY;  Service: Cardiovascular;  Laterality: N/A;  . Cardioversion 08/15/2011    Procedure: CARDIOVERSION;  Surgeon: [name], MD;  Location: [facility] ENDOSCOPY;  Service: Cardiovascular;  Laterality: N/A;    PT Assessment/Plan/Recommendation PT Assessment Clinical Impression Statement: Patient s/p respiratory failure and a-fib with need for O2 right now and did not wear O2 prior to admit.  Patient desat to 88-92% on RA with ambulation.  Patient is deconditioned and has some balance disturbance at present.  Will need short term NHP prior to d/c home as he stays with a caregiver, Rosey Bath who can only provide supervision.  Son could take pt. but cannot stay 24 hours either.  Son desires [facility] in [place] if  possible. PT Recommendation/Assessment: Patient will need skilled PT in the acute care venue PT Problem List: Decreased activity tolerance;Decreased balance;Decreased mobility;Decreased knowledge of use of DME;Decreased safety awareness Barriers to Discharge: Decreased caregiver support PT Therapy Diagnosis : Generalized weakness PT Plan PT Frequency: Min 3X/week PT Treatment/Interventions: DME instruction;Gait training;Functional mobility training;Therapeutic activities;Therapeutic exercise;Balance training;Patient/family education PT Recommendation Follow Up Recommendations: Home health PT;Supervision/Assistance - 24 hour Equipment Recommended: Defer to next venue PT Goals  Acute Rehab PT Goals PT Goal Formulation: With patient Time For Goal Achievement: 7 days Pt will go Supine/Side to Sit: Independently PT Goal: Supine/Side to Sit - Progress: Goal set today Pt will go Sit to Stand: Independently PT Goal: Sit to Stand - Progress: Goal set today Pt will Ambulate: >150 feet;with modified independence;with least restrictive assistive device PT Goal: Ambulate - Progress: Goal set today Pt will Go Up / Down Stairs: 1-2 stairs;with supervision;with least restrictive assistive device PT Goal: Up/Down Stairs - Progress: Goal set today Pt will Perform Home Exercise Program: Independently PT Goal: Perform Home Exercise Program - Progress: Goal set today  PT Evaluation Precautions/Restrictions  Precautions Precautions: Fall Required Braces or Orthoses: No Restrictions Weight Bearing Restrictions: No Prior Functioning  Home Living Lives With: Spouse Receives Help From: Family Type of Home: House Home Layout: One level Home Access: Stairs to enter Entrance Stairs-Rails: None Entrance Stairs-Number of Steps: 1 Bathroom Shower/Tub: Psychologist, counselling;Door Bathroom Toilet: Handicapped height Home Adaptive Equipment: Walker - standard Prior  Function Level of Independence: Independent with  basic ADLs;Independent with homemaking with ambulation;Independent with gait Driving: Yes Vocation: Retired Producer, television/film/video: Awake/alert Overall Cognitive Status: Appears within functional limits for tasks assessed Orientation Level: Oriented to person;Oriented to place;Oriented to time Sensation/Coordination Sensation Light Touch: Appears Intact Stereognosis: Not tested Hot/Cold: Not tested Proprioception: Not tested Coordination Gross Motor Movements are Fluid and Coordinated: No Fine Motor Movements are Fluid and Coordinated: No Extremity Assessment RUE Assessment RUE Assessment: Within Functional Limits LUE Assessment LUE Assessment: Within Functional Limits RLE Assessment RLE Assessment: Within Functional Limits LLE Assessment LLE Assessment: Within Functional Limits Mobility (including Balance) Bed Mobility Bed Mobility: Yes Rolling Right: 4: Min assist;With rail Rolling Right Details (indicate cue type and reason): cues for technique Rolling Left: Not tested (comment) Right Sidelying to Sit: 4: Min assist;With rails;HOB flat Right Sidelying to Sit Details (indicate cue type and reason): cues for technique Left Sidelying to Sit: Not tested (comment) Transfers Transfers: Yes Sit to Stand: 4: Min assist;From elevated surface;With upper extremity assist;From bed Sit to Stand Details (indicate cue type and reason): cues for hand placement Stand to Sit: 4: Min assist;With upper extremity assist;With armrests;To chair/3-in-1 Stand to Sit Details: cues for hand placement Ambulation/Gait Ambulation/Gait: Yes Ambulation/Gait Assistance: 4: Min assist Ambulation/Gait Assistance Details (indicate cue type and reason): Patient slightly unsteady on feet today with ambulation.  Needed to use RW for stability and patient did not use a RW PTA.  Needed cues to stay within RW as well. Ambulation Distance (Feet): 200 Feet Assistive device: Rolling walker Gait  Pattern: Step-through pattern;Trunk flexed Gait velocity: Slow cadence Stairs: No Wheelchair Mobility Wheelchair Mobility: No  Posture/Postural Control Posture/Postural Control: Postural limitations Postural Limitations: Slightly flexed posture Balance Balance Assessed: Yes Dynamic Standing Balance Dynamic Standing - Balance Support: Bilateral upper extremity supported;During functional activity Dynamic Standing - Level of Assistance: 3: Mod assist Dynamic Standing - Balance Activities: Lateral lean/weight shifting;Reaching across midline;Forward lean/weight shifting    End of Session PT - End of Session Equipment Utilized During Treatment: Gait belt Activity Tolerance: Patient limited by fatigue Patient left: in chair;with call bell in reach;with family/visitor present Nurse Communication: Mobility status for transfers;Mobility status for ambulation General Behavior During Session: [facility] for tasks performed Cognition: Impaired  Johnathan [name],Johnathan [name] 08/22/2011, 1:25 PM  [facility] [phone] (pager)

## 2011-08-22 NOTE — Progress Notes (Signed)
Speech Language Pathology Dysphagia Treatment  Patient Details Name: Johnathan [name] MRN: [id number] DOB: [birth year] Today's Date: 08/22/2011  SLP Assessment/Plan/Recommendation Assessment / Recommendations / Plan Clinical Impression Statement: Pt was seen by SLP for f/u to assess current diet tolerance. The patient has a baseline cough that persisted throughout PO intake. His son in the room does not feel as though the coughing was exacerbated by POs. Pt was belching after the swallow, although it was decreased from observation 3/26. The cough appears to be his baseline cough at this time, given the results of the MBS study 3/25, which indicated penetration of thin liquids only, which was spontaneously cleared each time. SLP provided min verbal cues for pt to follow strategies, and provided education to the pt and his visitors regarding diet recommendation, swallow precautions, and compensatory strategies. SLP will continue to f/u for education, diet tolerance, and strategy use. Continue with Current Diet: Regular;Thin liquid Liquids provided via: Cup;No straw Medication Administration: Whole meds with puree Supervision: Patient able to self feed;Intermittent supervision to cue for compensatory strategies Compensations: Slow rate;Small sips/bites;Multiple dry swallows after each bite/sip;Hard cough after swallow;Follow solids with liquid Postural Changes and/or Swallow Maneuvers: Seated upright 90 degrees;Upright 30-60 min after meal Oral Care Recommendations: Oral care BID Plan: Continue with current plan of care Swallowing Goals  SLP Swallowing Goals Patient will consume recommended diet without observed clinical signs of aspiration with: Modified independent assistance Swallow Study Goal #1 - Progress: Progressing toward goal Patient will utilize recommended strategies during swallow to increase swallowing safety with: Modified independent assistance Swallow Study Goal #2 - Progress:  Progressing toward goal  General Temperature Spikes Noted: No Respiratory Status: Supplemental O2 delivered via (comment) (Nasal cannula; 3L) Behavior/Cognition: Alert;Cooperative;Pleasant mood Oral Cavity - Dentition: Adequate natural dentition Patient Positioning: Upright in bed  Oral Cavity - Oral Hygiene Does patient have any of the following "at risk" factors?: Oxygen therapy - cannula, mask, simple oxygen devices Brush patient's teeth BID with toothbrush (using toothpaste with fluoride): Yes Patient is AT RISK - Oral Care Protocol followed (see row info): Yes   Dysphagia Treatment Treatment focused on: Skilled observation of diet tolerance;Patient/family/caregiver education;Utilization of compensatory strategies Family/Caregiver Educated: son, caregiver (unrelated) Treatment Methods/Modalities: Skilled observation;Differential diagnosis;Other (comment) (multiple swallows) Patient observed directly with PO's: Yes Type of PO's observed: Dysphagia 3 (soft);Thin liquids (ensure) Feeding: Able to feed self Liquids provided via: Cup;No straw Pharyngeal Phase Signs & Symptoms: Immediate cough (cough present at baseline; decreased belching from 3/26) Type of cueing: Verbal Amount of cueing: Minimal   Maxcine Ham 08/22/2011, 1:01 PM  Maxcine Ham, SLP Student

## 2011-08-22 NOTE — Procedures (Signed)
Myra Rude, M.S.,CCC-SLP Pager [phone]

## 2011-08-22 NOTE — Progress Notes (Signed)
Clinical Social Worker received referral for SNF placement. CSW will meet with patient and son tomorrow at 9:15am tomorrow.   Rozetta Nunnery MSW, [employer] [phone]

## 2011-08-22 NOTE — Progress Notes (Addendum)
[facility]  Subjective: No complaints.  Objective: Vital signs in last 24 hours: Temp:  [97.5 F (36.4 C)-99.1 F (37.3 C)] 97.5 F (36.4 C) (03/27 0753) Pulse Rate:  [84-97] 85  (03/27 0753) Resp:  [18-27] 26  (03/27 0753) BP: (113-129)/(54-75) 122/74 mmHg (03/27 0733) SpO2:  [93 %-98 %] 96 % (03/27 0807) Last BM Date: 08/21/11  Intake/Output from previous day: 03/26 0701 - 03/27 0700 In: 957.5 [P.O.:700; I.V.:120; IV Piggyback:137.5] Out: 1650 [Urine:1650] Intake/Output this shift:    Medications Current Facility-Administered Medications  Medication Dose Route Frequency Provider Last Rate Last Dose  . 0.9 %  sodium chloride infusion   Intravenous Continuous PRN Runell Gess, MD 10 mL/hr at 08/22/11 4540    . acetaminophen (TYLENOL) tablet 650 mg  650 mg Oral Q4H PRN Abelino Derrick, PA      . ALPRAZolam Prudy Feeler) tablet 0.25 mg  0.25 mg Oral BID PRN Abelino Derrick, PA      . amiodarone (PACERONE) tablet 400 mg  400 mg Oral BID [name], MD   400 mg at 08/21/11 2134  . aspirin chewable tablet 81 mg  81 mg Oral Daily Eda Paschal [place], [place]   81 mg at 08/21/11 0929  . digoxin (LANOXIN) tablet 0.125 mg  0.125 mg Oral Daily Eda Paschal [place], [place]   0.125 mg at 08/21/11 9811  . diltiazem (CARDIZEM LA) 24 hr tablet 120 mg  120 mg Oral Daily Eda Paschal [place], [place]   120 mg at 08/21/11 9147  . diphenoxylate-atropine (LOMOTIL) 2.5-0.025 MG per tablet 1 tablet  1 tablet Oral QID PRN Marykay Lex, MD   1 tablet at 08/22/11 0732  . donepezil (ARICEPT) tablet 10 mg  10 mg Oral QHS Eda Paschal [place], [place]   10 mg at 08/21/11 2134  . famotidine (PEPCID) tablet 20 mg  20 mg Oral BID Runell Gess, MD   20 mg at 08/21/11 2134  . furosemide (LASIX) tablet 40 mg  40 mg Oral Daily [name], MD      . guaiFENesin-dextromethorphan [facility] DM) 100-10 MG/5ML syrup 5 mL  5 mL Oral Q4H PRN Marykay Lex, MD      . isosorbide mononitrate (IMDUR) 24 hr tablet 30 mg   30 mg Oral Daily Marykay Lex, MD   30 mg at 08/21/11 0929  . levalbuterol (XOPENEX) nebulizer solution 0.63 mg  0.63 mg Nebulization TID Marykay Lex, MD   0.63 mg at 08/22/11 8295  . levalbuterol (XOPENEX) nebulizer solution 0.63 mg  0.63 mg Nebulization Q6H PRN Dwana Melena, PA      . metoprolol (LOPRESSOR) tablet 100 mg  100 mg Oral BID Eda Paschal [place], PA   100 mg at 08/21/11 2134  . mulitivitamin with minerals tablet 1 tablet  1 tablet Oral Daily Runell Gess, MD   1 tablet at 08/21/11 [phone]  . nitroGLYCERIN (NITROSTAT) SL tablet 0.4 mg  0.4 mg Sublingual Q5 Min x 3 PRN Abelino Derrick, PA      . pantoprazole (PROTONIX) EC tablet 40 mg  40 mg Oral BID WC Alyson Reedy, MD   40 mg at 08/22/11 0732  . piperacillin-tazobactam (ZOSYN) IVPB 3.375 g  3.375 g Intravenous Q8H Runell Gess, MD   3.375 g at 08/22/11 0610  . potassium chloride (K-DUR,KLOR-CON) CR tablet 10 mEq  10 mEq Oral Daily Dwana Melena, PA      .  potassium chloride SA (K-DUR,KLOR-CON) CR tablet 40 mEq  40 mEq Oral Once Dwana Melena, PA   40 mEq at 08/21/11 0929  . potassium chloride SA (K-DUR,KLOR-CON) CR tablet 40 mEq  40 mEq Oral Once Dwana Melena, PA   40 mEq at 08/21/11 1621  . DISCONTD: famotidine (PEPCID) IVPB 20 mg  20 mg Intravenous Q12H Marykay Lex, MD   20 mg at 08/20/11 2117  . DISCONTD: potassium chloride SA (K-DUR,KLOR-CON) CR tablet 40 mEq  40 mEq Oral Once Alyson Reedy, MD        PE: General appearance: alert, cooperative and no distress Lungs: Decreased BS greater on the right.  +rhonchi. Heart: irregularly irregular rhythm Extremities: No LEE Pulses: 2+ radials.  1+ DPs.  Lab Results:   Basename 08/22/11 0528 08/21/11 0548 08/20/11 0538  WBC 3.6* 3.9* 4.6  HGB 12.6* 12.1* 12.3*  HCT 38.3* 36.6* 36.8*  PLT 138* 137* 136*   BMET  Basename 08/22/11 0528 08/21/11 1320 08/21/11 0548 08/20/11 0538  NA 141 -- 139 139  K 3.5 3.5 3.1* --  CL 96 -- 96 97  CO2 36* -- 34* 33*  GLUCOSE 96  -- 91 91  BUN 15 -- 14 17  CREATININE 0.74 -- 0.76 0.64  CALCIUM 8.6 -- 8.5 8.5    Assessment/Plan  Principal Problem:  *Respiratory failure Active Problems:  Atrial fibrillation, s/p recent failed TEE/CV, on AMIO and Xarelto  CAD, LAD stent 2003, 2006, patent with distal disease this admission  HTN (hypertension)  Pacemaker, St Jude 2006  Cardiomyopathy, EF 45%  Possible: Hemoptysis,  from Xarelto   Hypoxia, PO2 50s  Plan:  Afebrile overnight.  Will change Zosyn to augmentin (500mg  TID)per PCCM for eight days.  BP and HR stable and controlled.  PT/OT eval prior to DC.   Disposition: likely home today.   LOS: 3 days    HAGER,BRYAN W 08/22/2011 8:13 AM  ATTENDING ATTESTATION: I have seen and examined the patient along with Wilburt Finlay, PA.  I have reviewed the chart, notes and new data.  I agree with Bryan's note.  Brief Description: Very pleasnt 78 y/o man with CAD (PCI) & PPM, mild ICM Ef~45% recently discharged on Amiodarone & Xarelto after failed TEE/DCCV for Afib --> admitted for acute SOB with initial concern for "pulmonary hemorrhage" but most consistent with PNA & mild pulm edema.  PNA thought to be 2/2 aspiration -> converting from IV Zosyn to Augmentin today. Some ICU delirium yesterday.   Breathing & coughing much improved - but still has productive cough.  Using flutter valve & IS.   Key new complaints: less confused today.  Family (wife & son) voice concern as to his stability for d/c home.  ?? Home health needs etc.  Concern for potential weakness & falling as he is on full anticoagulation.    Key examination changes: lungs improved but still rhonchorous & decreased BS on R.    Key new findings / data: Afebrile. No WBC elevation.  PLAN:  Still in Afib - but rate slowing as A & Vpacer spikes more frequent. Continue Amiodarone.  Restart Xarelto at 15mg  tomorrow.  Continue diltiazem & metoprolol (BP tolerating)  CAD/ICM: Restarting home Lasix dose today.   Has condom cath & voiding ok.  On BB & Nitrate  Converting to PO Augmentin today..   Need PT Eval for stability on discharge.  He seems quite weak & may well benefit from transfer to telemetry with another day  of ambulation here before discharge.  He is eager to go home, as is the family, but want to be "safe" when they do.  Son is voicing most concern as his elderly "lady friend" will be the primary care giver. Son would be willing to take his father home if needed.    If PT feels that he is ok for d/c with home health & CM can come through today - could d/c later today vs. Tomorrow.  Need to check SaO2 on room air -- may need home O2 short term during recovery phase.  Marykay Lex, M.D., M.S. [facility] [address] [place], [place]  [address]  [phone]  08/22/2011 11:04 AM  ADDENDUM: Just seen by PT -- quite unsteady without walker & not sure how to use walker, also SaO2 86 off O2 in bed.   After discussion with family - PT & family feel that he would benefit from short-term In-pt rehab (SNF) as opposed to home health /PT. Will consult SW & transfer to telemetry.  D/c timing will be dependent upon bed availabilty. Will need home O2 based upon desaturation.    Marykay Lex, M.D., M.S. [facility] [address] [place], [place]  [address]  [phone] Pager # [phone]  08/22/2011 11:37 AM

## 2011-08-23 MED ORDER — RIVAROXABAN 15 MG PO TABS
15.0000 mg | ORAL_TABLET | Freq: Every day | ORAL | Status: DC
  Administered 2011-08-23 – 2011-08-24 (×2): 15 mg via ORAL
  Filled 2011-08-23 (×2): qty 1

## 2011-08-23 MED ORDER — METOPROLOL TARTRATE 100 MG PO TABS: 50.0000 mg | ORAL_TABLET | Freq: Two times a day (BID) | ORAL | Status: DC

## 2011-08-23 MED ORDER — PANTOPRAZOLE SODIUM 40 MG PO TBEC: 40.0000 mg | DELAYED_RELEASE_TABLET | Freq: Two times a day (BID) | ORAL | Status: DC

## 2011-08-23 MED ORDER — DIGOXIN 0.0625 MG HALF TABLET
0.0625 mg | ORAL_TABLET | Freq: Every day | ORAL | Status: DC
  Administered 2011-08-24: 0.0625 mg via ORAL
  Filled 2011-08-23: qty 1

## 2011-08-23 MED ORDER — METOPROLOL TARTRATE 50 MG PO TABS
50.0000 mg | ORAL_TABLET | Freq: Two times a day (BID) | ORAL | Status: DC
  Administered 2011-08-23 – 2011-08-24 (×3): 50 mg via ORAL
  Filled 2011-08-23 (×4): qty 1

## 2011-08-23 MED ORDER — AMIODARONE HCL 200 MG PO TABS: 400.0000 mg | ORAL_TABLET | Freq: Two times a day (BID) | ORAL | Status: DC

## 2011-08-23 MED ORDER — ISOSORBIDE MONONITRATE 15 MG HALF TABLET: 30.0000 mg | ORAL_TABLET | Freq: Every day | ORAL | Status: DC

## 2011-08-23 MED ORDER — ISOSORBIDE MONONITRATE ER 30 MG PO TB24
30.0000 mg | ORAL_TABLET | Freq: Every day | ORAL | Status: DC
  Administered 2011-08-24: 30 mg via ORAL
  Filled 2011-08-23 (×2): qty 1

## 2011-08-23 MED ORDER — RIVAROXABAN 15 MG PO TABS: 15.0000 mg | ORAL_TABLET | Freq: Every day | ORAL | Status: DC

## 2011-08-23 MED ORDER — POTASSIUM CHLORIDE CRYS ER 10 MEQ PO TBCR: 10.0000 meq | EXTENDED_RELEASE_TABLET | Freq: Every day | ORAL | Status: AC

## 2011-08-23 MED ORDER — AMOXICILLIN-POT CLAVULANATE 500-125 MG PO TABS: 1.0000 | ORAL_TABLET | Freq: Three times a day (TID) | ORAL | Status: AC

## 2011-08-23 MED ORDER — GUAIFENESIN-DM 100-10 MG/5ML PO SYRP: 5.0000 mL | ORAL_SOLUTION | ORAL | Status: AC | PRN

## 2011-08-23 MED ORDER — FUROSEMIDE 40 MG PO TABS: 40.0000 mg | ORAL_TABLET | Freq: Every day | ORAL | Status: DC

## 2011-08-23 MED ORDER — LEVALBUTEROL HCL 0.63 MG/3ML IN NEBU: 0.6300 mg | INHALATION_SOLUTION | Freq: Four times a day (QID) | RESPIRATORY_TRACT | Status: DC | PRN

## 2011-08-23 MED ORDER — DIGOXIN 0.0625 MG HALF TABLET: 0.0625 mg | ORAL_TABLET | Freq: Every day | ORAL | Status: DC

## 2011-08-23 NOTE — Progress Notes (Signed)
Called by RN secondary to low B/P. Systolic B/P running around 100. I cut Metoprolol to 50mg  BID, stopped Diltiazem, and put hold parameters on Imdur and Lasix.  Enrique Manganaro PA-C 08/23/2011 10:00 AM

## 2011-08-23 NOTE — Progress Notes (Signed)
CSW gave patient bed offer received from [facility]. Facility is able to receive today if patient is medically stable. CSW will notify MD.   Rozetta Nunnery MSW, [facility] [phone]

## 2011-08-23 NOTE — Progress Notes (Signed)
Pt. Has SNF at [facility], but we need to resume Xarelto and make sure no bleeding and we have adjusted meds today.  Should be ready for d/c in am.

## 2011-08-23 NOTE — Progress Notes (Signed)
[facility]  DAILY PROGRESS NOTE   Subjective:  Alert, disoriented but conversant and pleasant. Eating breakfast without coughing or choking. Afebrile. Urine output marked negative, but not sure "IN"s recorded fully. On monitor rhythm is irregular but he appears to be intermittently pacing the atrium and definitely tracking the atrium at times - may be in SR w PACs and runs of AT?  Objective:  Temp:  [97.4 F (36.3 C)-98.4 F (36.9 C)] 97.4 F (36.3 C) (03/28 0500) Pulse Rate:  [70-84] 75  (03/28 0500) Resp:  [17-20] 20  (03/28 0500) BP: (100-146)/(58-77) 118/76 mmHg (03/28 0500) SpO2:  [94 %-100 %] 94 % (03/28 0500) Weight:  [60.238 kg (132 lb 12.8 oz)] 60.238 kg (132 lb 12.8 oz) (03/28 0500) Weight change:   Intake/Output from previous day: 03/27 0701 - 03/28 0700 In: 277.5 [P.O.:240; IV Piggyback:37.5] Out: 2151 [Urine:2150; Stool:1]  Intake/Output from this shift:    Medications: Current Facility-Administered Medications  Medication Dose Route Frequency Provider Last Rate Last Dose  . 0.9 %  sodium chloride infusion   Intravenous Continuous PRN Runell Gess, MD 10 mL/hr at 08/22/11 1610    . acetaminophen (TYLENOL) tablet 650 mg  650 mg Oral Q4H PRN Abelino Derrick, PA      . ALPRAZolam Prudy Feeler) tablet 0.25 mg  0.25 mg Oral BID PRN Abelino Derrick, PA      . amiodarone (PACERONE) tablet 400 mg  400 mg Oral BID [name], MD   400 mg at 08/22/11 2300  . amoxicillin-clavulanate (AUGMENTIN) 500-125 MG per tablet 500 mg  1 tablet Oral Q8H Dwana Melena, PA   500 mg at 08/23/11 9604  . aspirin chewable tablet 81 mg  81 mg Oral Daily Eda Paschal [place], [place]   81 mg at 08/22/11 0956  . digoxin (LANOXIN) tablet 0.125 mg  0.125 mg Oral Daily Eda Paschal [place], PA   0.125 mg at 08/22/11 0957  . diltiazem (CARDIZEM LA) 24 hr tablet 120 mg  120 mg Oral Daily Eda Paschal [place], [place]   120 mg at 08/22/11 0956  . diphenoxylate-atropine (LOMOTIL) 2.5-0.025 MG per  tablet 1 tablet  1 tablet Oral QID PRN Marykay Lex, MD   1 tablet at 08/22/11 1636  . donepezil (ARICEPT) tablet 10 mg  10 mg Oral QHS Eda Paschal [place], [place]   10 mg at 08/22/11 2300  . famotidine (PEPCID) tablet 20 mg  20 mg Oral BID Runell Gess, MD   20 mg at 08/22/11 2259  . furosemide (LASIX) tablet 40 mg  40 mg Oral Daily [name], MD   40 mg at 08/22/11 0957  . guaiFENesin-dextromethorphan (ROBITUSSIN DM) 100-10 MG/5ML syrup 5 mL  5 mL Oral Q4H PRN Marykay Lex, MD      . isosorbide mononitrate (IMDUR) 24 hr tablet 30 mg  30 mg Oral Daily Marykay Lex, MD   30 mg at 08/22/11 0956  . levalbuterol (XOPENEX) nebulizer solution 0.63 mg  0.63 mg Nebulization TID Marykay Lex, MD   0.63 mg at 08/22/11 2001  . levalbuterol (XOPENEX) nebulizer solution 0.63 mg  0.63 mg Nebulization Q6H PRN Dwana Melena, PA      . metoprolol (LOPRESSOR) tablet 100 mg  100 mg Oral BID Eda Paschal [place], PA   100 mg at 08/22/11 2300  . mulitivitamin with minerals tablet 1 tablet  1 tablet Oral Daily Runell Gess, MD   1  tablet at 08/22/11 0957  . nitroGLYCERIN (NITROSTAT) SL tablet 0.4 mg  0.4 mg Sublingual Q5 Min x 3 PRN Abelino Derrick, PA      . pantoprazole (PROTONIX) EC tablet 40 mg  40 mg Oral BID WC Alyson Reedy, MD   40 mg at 08/23/11 0723  . potassium chloride (K-DUR,KLOR-CON) CR tablet 10 mEq  10 mEq Oral Daily Dwana Melena, PA   10 mEq at 08/22/11 0957  . DISCONTD: piperacillin-tazobactam (ZOSYN) IVPB 3.375 g  3.375 g Intravenous Q8H Dwana Melena, PA   3.375 g at 08/22/11 0610    Physical Exam: General appearance: alert, cooperative, distracted and no distress Neck: no adenopathy, no carotid bruit, no JVD, supple, symmetrical, trachea midline and thyroid not enlarged, symmetric, no tenderness/mass/nodules Lungs: rhonchi bilaterally Heart: irregularly irregular rhythm, S1, S2 normal and systolic murmur: early systolic 2/6, crescendo and decrescendo at 2nd right intercostal  space Abdomen: soft, non-tender; bowel sounds normal; no masses,  no organomegaly Extremities: extremities normal, atraumatic, no cyanosis or edema  Lab Results: Results for orders placed during the hospital encounter of 08/19/11 (from the past 48 hour(s))  POTASSIUM     Status: Normal   Collection Time   08/21/11  1:20 PM      Component Value Range Comment   Potassium 3.5  3.5 - 5.1 (mEq/L)   GLUCOSE, CAPILLARY     Status: Abnormal   Collection Time   08/21/11  5:07 PM      Component Value Range Comment   Glucose-Capillary 122 (*) 70 - 99 (mg/dL)   BASIC METABOLIC PANEL     Status: Abnormal   Collection Time   08/22/11  5:28 AM      Component Value Range Comment   Sodium 141  135 - 145 (mEq/L)    Potassium 3.5  3.5 - 5.1 (mEq/L)    Chloride 96  96 - 112 (mEq/L)    CO2 36 (*) 19 - 32 (mEq/L)    Glucose, Bld 96  70 - 99 (mg/dL)    BUN 15  6 - 23 (mg/dL)    Creatinine, Ser 1.61  0.50 - 1.35 (mg/dL)    Calcium 8.6  8.4 - 10.5 (mg/dL)    GFR calc non Af Amer 77 (*) >90 (mL/min)    GFR calc Af Amer 89 (*) >90 (mL/min)   CBC     Status: Abnormal   Collection Time   08/22/11  5:28 AM      Component Value Range Comment   WBC 3.6 (*) 4.0 - 10.5 (K/uL)    RBC 3.72 (*) 4.22 - 5.81 (MIL/uL)    Hemoglobin 12.6 (*) 13.0 - 17.0 (g/dL)    HCT 09.6 (*) 04.5 - 52.0 (%)    MCV 103.0 (*) 78.0 - 100.0 (fL)    MCH 33.9  26.0 - 34.0 (pg)    MCHC 32.9  30.0 - 36.0 (g/dL)    RDW 40.9  81.1 - 91.4 (%)    Platelets 138 (*) 150 - 400 (K/uL)   DIGOXIN LEVEL     Status: Normal   Collection Time   08/22/11  1:39 PM      Component Value Range Comment   Digoxin Level 0.9  0.8 - 2.0 (ng/mL)     Imaging: No results found.  Assessment:  1. Principal Problem: 2.  *Respiratory failure 3. Active Problems: 4.  Atrial fibrillation, s/p recent failed TEE/CV, on AMIO and Xarelto 5.  CAD, LAD stent 2003,  2006, patent with distal disease this admission 6.  HTN (hypertension) 7.  Pacemaker, St Jude  2006 8.  Cardiomyopathy, EF 45% 9.  Possible: Hemoptysis,  from Xarelto  10.  Hypoxia, PO2 50s 11.   Plan:  1. Check pacer for rhythm diagnosis 2. Switch to PO ABx and discharge? Note no WBC elevation or fever  Time Spent Directly with Patient:  30 minutes  Length of Stay:  LOS: 4 days    Mitchel Delduca 08/23/2011, 8:18 AM

## 2011-08-23 NOTE — Progress Notes (Addendum)
Clinical Social Work Department CLINICAL SOCIAL WORK PLACEMENT NOTE 08/23/2011  Patient:  Johnathan [name], Johnathan [name]  Account Number:  [id number] Admit date:  08/19/2011  Clinical Social Worker:  Hulan Fray  Date/time:  08/23/2011 09:45 AM  Clinical Social Work is seeking post-discharge placement for this patient at the following level of care:   SKILLED NURSING   (*CSW will update this form in Epic as items are completed)   08/23/2011  Patient/family provided with [facility] Department of Clinical Social Work's list of facilities offering this level of care within the geographic area requested by the patient (or if unable, by the patient's family).  08/23/2011  Patient/family informed of their freedom to choose among providers that offer the needed level of care, that participate in Medicare, Medicaid or managed care program needed by the patient, have an available bed and are willing to accept the patient.    Patient/family informed of [facility]' ownership interest in [facility], as well as of the fact that they are under no obligation to receive care at this facility.  PASARR submitted to EDS on 08/23/2011 PASARR number received from EDS on   FL2 transmitted to all facilities in geographic area requested by pt/family on  08/23/2011 FL2 transmitted to all facilities within larger geographic area on   Patient informed that his/her managed care company has contracts with or will negotiate with  certain facilities, including the following:     Patient/family informed of bed offers received:  08/23/11 Patient chooses bed at [facility] Nursing Physician recommends and patient chooses bed at    Patient to be transferred to  on  08/24/11 Patient to be transferred to facility by Private Vehicle of son  The following physician request were entered in Epic:   Additional Comments:  08/24/11 15:59pm EMS was called to transport patient around 2pm, but facility needed to receive  patient before 4pm. Due to the time sensitive nature of the patient being received at facility, the son has decided to transport patient because EMS had not arrived yet.

## 2011-08-23 NOTE — Progress Notes (Signed)
Occupational Therapy Evaluation Patient Details Name: Johnathan [name] MRN: [id number] DOB: [birth year] Today's Date: 08/23/2011  Problem List:  Patient Active Problem List  Diagnoses  . Atrial fibrillation, s/p recent failed TEE/CV, on AMIO and Xarelto  . Troponin level elevated, 0.74  . CAD, LAD stent 2003, 2006, patent with distal disease this admission  . HTN (hypertension)  . Pacemaker, St Jude 2006  . BPH (benign prostatic hyperplasia)  . Cardiomyopathy, EF 45%  . Possible: Hemoptysis,  from Xarelto   . Hypoxia, PO2 50s  . Respiratory failure    Past Medical History:  Past Medical History  Diagnosis Date  . Myocardial infarct 2003    LAD stent  . Coronary artery disease 2006    re do LAD stent  . Bradycardia 2006    St Jude pacemaker  . BPH (benign prostatic hyperplasia)   . Dysrhythmia   . Pacemaker   . Shortness of breath   . Arthritis    Past Surgical History:  Past Surgical History  Procedure Date  . Pacemaker insertion   . Appendectomy   . Cardiac catheterization   . Insert / replace / remove pacemaker   . Tee without cardioversion 08/15/2011    Procedure: TRANSESOPHAGEAL ECHOCARDIOGRAM (TEE);  Surgeon: [name], MD;  Location: [facility] ENDOSCOPY;  Service: Cardiovascular;  Laterality: N/A;  . Cardioversion 08/15/2011    Procedure: CARDIOVERSION;  Surgeon: [name], MD;  Location: [facility] ENDOSCOPY;  Service: Cardiovascular;  Laterality: N/A;    OT Assessment/Plan/Recommendation  Pt will benefit from OT to increase I and return to PLOF s/p hospitalization.  Pt will need SNF. OT Goals Acute Rehab OT Goals OT Goal Formulation: With patient ADL Goals Pt Will Perform Eating: with set-up;Sitting, chair ADL Goal: Eating - Progress: Goal set today Pt Will Perform Grooming: Standing at sink;with supervision ADL Goal: Grooming - Progress: Goal set today Pt Will Transfer to Toilet: with supervision;Comfort height toilet;Ambulation ADL Goal: Toilet Transfer -  Progress: Goal set today  OT Evaluation Precautions/Restrictions  Precautions Precautions: Fall Required Braces or Orthoses: No Restrictions Weight Bearing Restrictions: No Prior Functioning Home Living Lives With: Spouse Receives Help From: Family Type of Home: House Home Layout: One level Home Access: Stairs to enter Entrance Stairs-Rails: None Entrance Stairs-Number of Steps: 1 Bathroom Shower/Tub: Psychologist, counselling;Door Bathroom Toilet: Handicapped height Home Adaptive Equipment: Walker - standard Prior Function Level of Independence: Independent with basic ADLs;Independent with homemaking with ambulation;Independent with gait Driving: Yes Vocation: Retired ADL ADL Grooming: Simulated;Minimal assistance Where Assessed - Grooming: Sitting, bed;Unsupported Upper Body Bathing: Simulated;Minimal assistance Where Assessed - Upper Body Bathing: Unsupported;Sitting, bed Lower Body Bathing: Simulated;Moderate assistance Where Assessed - Lower Body Bathing: Sit to stand from bed Upper Body Dressing: Simulated;Minimal assistance Where Assessed - Upper Body Dressing: Sitting, bed;Unsupported Lower Body Dressing: Simulated;Moderate assistance Where Assessed - Lower Body Dressing: Sit to stand from bed Toilet Transfer: Simulated;Moderate assistance Toilet Transfer Details (indicate cue type and reason): bed to chair transfer Toilet Transfer Method: Stand pivot Toileting - Clothing Manipulation: Simulated;Moderate assistance Where Assessed - Toileting Clothing Manipulation: Standing Toileting - Hygiene: Simulated Where Assessed - Toileting Hygiene: Standing Tub/Shower Transfer: Moderate assistance ADL Comments: Pt planning on going to SNF for rehab Vision/Perception    Cognition Cognition Arousal/Alertness: Awake/alert Overall Cognitive Status: Appears within functional limits for tasks assessed Orientation Level: Oriented to person;Oriented to place   Extremity  Assessment RUE Assessment RUE Assessment: Within Functional Limits LUE Assessment LUE Assessment: Within Functional Limits Mobility  Bed  Mobility Right Sidelying to Sit: 4: Min assist;With rails Transfers Sit to Stand: 3: Mod assist;With upper extremity assist Stand to Sit: 4: Min assist;With upper extremity assist    End of Session OT - End of Session Activity Tolerance: Patient tolerated treatment well Patient left: in chair;with call bell in reach;with family/visitor present General Behavior During Session: [facility] for tasks performed Cognition: Impaired (slow to respond at times)   Einar Crow D 08/23/2011, 12:18 PM

## 2011-08-23 NOTE — Progress Notes (Signed)
Clinical Social Work Department BRIEF PSYCHOSOCIAL ASSESSMENT 08/23/2011  Patient:  Johnathan [name], Johnathan [name]     Account Number:  [id number]     Admit date:  08/19/2011  Clinical Social Worker:  Hulan Fray  Date/Time:  08/23/2011 09:36 AM  Referred by:  Physician  Date Referred:   Referred for  SNF Placement   Other Referral:   Interview type:  Other - See comment Other interview type:   Patient, significant other, Aggie Cosier, and son, Peyton Najjar.    PSYCHOSOCIAL DATA Living Status:  SIGNIFICANT OTHER Admitted from facility:   Level of care:   Primary support name:  Aggie Cosier and Peyton Najjar Primary support relationship to patient:  FAMILY Degree of support available:   Supportive    CURRENT CONCERNS Current Concerns  Post-Acute Placement   Other Concerns:    SOCIAL WORK ASSESSMENT / PLAN Clinical Social Worker met with Aggie Cosier, significant other, son, Peyton Najjar and patient to discuss the need for short term rehabilitation. All members preferred [facility] SNF for patient to receive rehab. Patient and family were pleasant. CSW completed FL2 and faxed out to [facility]. CSW will update patient with response from the facility. FL2 will be placed in shadow chart for MD's signature.   Assessment/plan status:  Psychosocial Support/Ongoing Assessment of Needs Other assessment/ plan:   Information/referral to community resources:   SNF packet    PATIENT'S/FAMILY'S RESPONSE TO PLAN OF CARE: Patient, Peyton Najjar (cell: [phone]) and Aggie Cosier were in agreement for short term rehabilitation. All are hopeful for [facility] SNF.

## 2011-08-24 LAB — BASIC METABOLIC PANEL
BUN: 18 mg/dL (ref 6–23)
CO2: 34 mEq/L — ABNORMAL HIGH (ref 19–32)
Calcium: 9 mg/dL (ref 8.4–10.5)
Chloride: 99 mEq/L (ref 96–112)
Creatinine, Ser: 0.7 mg/dL (ref 0.50–1.35)
GFR calc Af Amer: >90 mL/min (ref >90)
GFR calc non Af Amer: 79 mL/min — ABNORMAL LOW (ref >90)
Glucose, Bld: 97 mg/dL (ref 70–99)
Potassium: 3.7 mEq/L (ref 3.5–5.1)
Sodium: 144 mEq/L (ref 135–145)

## 2011-08-24 LAB — CBC
HCT: 45.7 % (ref 39.0–52.0)
Hemoglobin: 14.9 g/dL (ref 13.0–17.0)
MCHC: 32.6 g/dL (ref 30.0–36.0)
RBC: 4.41 MIL/uL (ref 4.22–5.81)
WBC: 4.4 10*3/uL (ref 4.0–10.5)

## 2011-08-24 LAB — PRO B NATRIURETIC PEPTIDE: Pro B Natriuretic peptide (BNP): 3359 pg/mL — ABNORMAL HIGH (ref 0–450)

## 2011-08-24 NOTE — Progress Notes (Signed)
Speech Language Pathology Dysphagia Treatment  Patient Details Name: Johnathan [name] MRN: [id number] DOB: 07-26-1915 Today's Date: 08/24/2011  SLP Assessment/Plan/Recommendation Assessment / Recommendations / Plan Clinical Impression Statement: Pt with persisting cough at baseline.  Min verbal cues needed to follow strategies to enhance swallowing safety. Pt with difficulty recalling precautions or other new, auditory information; however,  appears to be tolerating POs relatively well.  For likely D/C today to SNF for rehab. Continue with Current Diet: Regular;Thin liquid Liquids provided via: Cup Medication Administration: Whole meds with puree Postural Changes and/or Swallow Maneuvers: Seated upright 90 degrees;Upright 30-60 min after meal Plan: Continue with current plan of care Swallowing Goals  SLP Swallowing Goals Patient will consume recommended diet without observed clinical signs of aspiration with: Modified independent assistance Swallow Study Goal #1 - Progress: Progressing toward goal Patient will utilize recommended strategies during swallow to increase swallowing safety with: Modified independent assistance Swallow Study Goal #2 - Progress: Progressing toward goal  General Temperature Spikes Noted: No Respiratory Status: Supplemental O2 delivered via (comment) (lungs diminished at bases) Behavior/Cognition: Alert;Cooperative;Pleasant mood Oral Cavity - Dentition: Adequate natural dentition Patient Positioning: Upright in chair     Dysphagia Treatment Treatment focused on: Skilled observation of diet tolerance;Patient/family/caregiver education;Utilization of compensatory strategies Family/Caregiver Educated: son Treatment Methods/Modalities: Skilled observation Patient observed directly with PO's: Yes Type of PO's observed: Regular;Thin liquids Feeding: Able to feed self Liquids provided via: Cup Pharyngeal Phase Signs & Symptoms: Immediate cough (present  baseline) Type of cueing: Verbal   Blenda Mounts Laurice 08/24/2011, 11:33 AM

## 2011-08-24 NOTE — Progress Notes (Signed)
[facility]  Subjective: No complaints. Feeling stronger.  Objective: Vital signs in last 24 hours: Temp:  [97.4 F (36.3 C)-98.2 F (36.8 C)] 98.2 F (36.8 C) (03/29 0500) Pulse Rate:  [67-75] 75  (03/29 1001) Resp:  [15-19] 19  (03/29 0500) BP: (112-145)/(59-73) 121/73 mmHg (03/29 1001) SpO2:  [96 %-98 %] 96 % (03/29 0500) Weight:  [59.784 kg (131 lb 12.8 oz)] 59.784 kg (131 lb 12.8 oz) (03/29 0500) Last BM Date: 08/23/11  Intake/Output from previous day: 03/28 0701 - 03/29 0700 In: 720 [P.O.:720] Out: 1803 [Urine:1800; Stool:3] Intake/Output this shift:    Medications Current Facility-Administered Medications  Medication Dose Route Frequency Provider Last Rate Last Dose  . 0.9 %  sodium chloride infusion   Intravenous Continuous PRN Runell Gess, MD 20 mL/hr at 08/24/11 [phone]    . acetaminophen (TYLENOL) tablet 650 mg  650 mg Oral Q4H PRN Abelino Derrick, PA      . ALPRAZolam Prudy Feeler) tablet 0.25 mg  0.25 mg Oral BID PRN Abelino Derrick, PA      . amiodarone (PACERONE) tablet 400 mg  400 mg Oral BID [name], MD   400 mg at 08/24/11 1004  . amoxicillin-clavulanate (AUGMENTIN) 500-125 MG per tablet 500 mg  1 tablet Oral Q8H Dwana Melena, PA   500 mg at 08/24/11 0549  . aspirin chewable tablet 81 mg  81 mg Oral Daily Eda Paschal [place], [place]   81 mg at 08/24/11 1004  . digoxin (LANOXIN) tablet 0.0625 mg  0.0625 mg Oral Daily Nada Boozer, NP   0.0625 mg at 08/24/11 1004  . diphenoxylate-atropine (LOMOTIL) 2.5-0.025 MG per tablet 1 tablet  1 tablet Oral QID PRN Marykay Lex, MD   1 tablet at 08/24/11 0236  . donepezil (ARICEPT) tablet 10 mg  10 mg Oral QHS Eda Paschal [place], [place]   10 mg at 08/23/11 2126  . furosemide (LASIX) tablet 40 mg  40 mg Oral Daily Eda Paschal [place], [place]   40 mg at 08/24/11 1004  . guaiFENesin-dextromethorphan (ROBITUSSIN DM) 100-10 MG/5ML syrup 5 mL  5 mL Oral Q4H PRN Marykay Lex, MD      . isosorbide mononitrate (IMDUR) 24  hr tablet 30 mg  30 mg Oral Daily Eda Paschal [place], [place]   30 mg at 08/24/11 1004  . levalbuterol (XOPENEX) nebulizer solution 0.63 mg  0.63 mg Nebulization TID Marykay Lex, MD   0.63 mg at 08/23/11 2135  . levalbuterol (XOPENEX) nebulizer solution 0.63 mg  0.63 mg Nebulization Q6H PRN Dwana Melena, PA      . metoprolol (LOPRESSOR) tablet 50 mg  50 mg Oral BID Eda Paschal [place], PA   50 mg at 08/24/11 1005  . mulitivitamin with minerals tablet 1 tablet  1 tablet Oral Daily Runell Gess, MD   1 tablet at 08/24/11 1004  . nitroGLYCERIN (NITROSTAT) SL tablet 0.4 mg  0.4 mg Sublingual Q5 Min x 3 PRN Abelino Derrick, PA      . pantoprazole (PROTONIX) EC tablet 40 mg  40 mg Oral BID WC Alyson Reedy, MD   40 mg at 08/24/11 0737  . potassium chloride (K-DUR,KLOR-CON) CR tablet 10 mEq  10 mEq Oral Daily Dwana Melena, PA   10 mEq at 08/24/11 1004  . Rivaroxaban (XARELTO) tablet TABS 15 mg  15 mg Oral Daily Nada Boozer, NP   15 mg at 08/24/11 1004  . DISCONTD:  digoxin (LANOXIN) tablet 0.125 mg  0.125 mg Oral Daily Eda Paschal [place], PA   0.125 mg at 08/23/11 1034  . DISCONTD: famotidine (PEPCID) tablet 20 mg  20 mg Oral BID Runell Gess, MD   20 mg at 08/23/11 1034    PE: General appearance: alert, cooperative and no distress Lungs: slight bilateral wheeze. Heart: regular rate and rhythm, S1, S2 normal, no murmur, click, rub or gallop Extremities: No LEE Pulses: Radials 2+ and symmetric  Lab Results:   Basename 08/24/11 0645 08/22/11 0528  WBC 4.4 3.6*  HGB 14.9 12.6*  HCT 45.7 38.3*  PLT 160 138*   BMET  Basename 08/24/11 0645 08/22/11 0528 08/21/11 1320  NA 144 141 --  K 3.7 3.5 3.5  CL 99 96 --  CO2 34* 36* --  GLUCOSE 97 96 --  BUN 18 15 --  CREATININE 0.70 0.74 --  CALCIUM 9.0 8.6 --    Assessment/Plan  Principal Problem:  *Respiratory failure, on admit, now resolved Active Problems:  Atrial fibrillation, s/p recent failed TEE/CV, 08/15/2011, on AMIO and Xarelto  CAD, LAD  stent 2003, 2006, patent with distal disease by cath 08/13/11  HTN (hypertension)  Pacemaker, St Jude 2006  Cardiomyopathy, EF 45%   Hemoptysis,  from chronic cough in combination with Xarelto was minimal and now resolved  Hypoxia, PO2 50s  Plan:  Afib CVR.  Xarelto.  BP 110/84 - 145/71.  SNF([facility]).  O2 sats 95% on RA while sitting.  DC today.  Discussed continuing to use respiratory devices when he leaves.    LOS: 5 days    Johnathan [name] 08/24/2011 10:28 AM  I have seen & examined the patient today & reviewed the chart.  Medication adjustments made overnight reviewed. I agree with Johnathan [name] findings & assessment / plan.  He looks & feels much stronger today as opposed to the past few days. He is ready for discharge of Xarelto 15mg   = no further hemoptysis & SSx of PNA much improved. PO Abx to complete course per PCCM recommendations.  He is otherwise stable for discharge to SNF today.  Marykay Lex, M.D., M.S. [facility] [address] [place], [place]  [address]  [phone] Pager # [phone]  08/24/2011 1:52 PM

## 2011-08-24 NOTE — Progress Notes (Signed)
Clinical Social Worker faxed facility discharge summary and after visit summary. CSW is awaiting MD's signature for FL2 in shadow chart. Facility will need to receive patient before 4 pm today. Patient will be transported via EMS. CSW will facilitate discharge and print out patient's discharge packet. The packet, EMS form and face sheet will be placed in [place].   Rozetta Nunnery MSW, [employer] [phone]

## 2011-08-24 NOTE — Progress Notes (Addendum)
DC orders received.  Patient stable with no S/S of distress.  Medication and discharge teaching reviewed with patient and son at bedside.  Report called to [facility] and spoke with Amy, RN.  Patient to be DC via ambulance to SNF. Nolon Nations  Patient's son took patient via private vehicle to [facility].  Nolon Nations

## 2011-08-24 NOTE — Discharge Summary (Signed)
Physician Discharge Summary  Patient ID: Johnathan [name] MRN: [id number] DOB/AGE: February 22, 1916 78 y.o.  Admit date: 08/19/2011 Discharge date: 08/24/2011  Admission Diagnoses:  Respiratory failure  Discharge Diagnoses:  Principal Problem:  *Respiratory failure, on admit, now resolved Active Problems:  Atrial fibrillation, s/p recent failed TEE/CV, 08/15/2011, on AMIO and Xarelto  CAD, LAD stent 2003, 2006, patent with distal disease by cath 08/13/11  HTN (hypertension)  Pacemaker, St Jude 2006  Cardiomyopathy, EF 45%   Hemoptysis,  from chronic cough in combination with Xarelto was minimal and now resolved  Hypoxia, PO2 50s   Discharged Condition: stable  Hospital Course:  78 y/o with a history of CAD, PAF/ SSS/ s/p PTVDP. He was recently seen by his primary MD and was noted to have a rapid HR. He was referred to our office and seen by Dr Allyson Sabal. EKG and pacer interrogation reviewed by Dr Allyson Sabal and Dr Royann Shivers and it was felt the pt had previously been in NSR so the AF recurrence was new. Dr Allyson Sabal adjusted his medications for rate control and added Xarelto. Unfortunately the pt's lab came back abnormal with an elevated Troponin and he was then admitted for heparin and set up for diagnostic cath. This was done 3/18 and the plan is for continued medical Rx. We attempted a TEE/CV 3/20 but this was unnsuccessful. The plan now is to add Xarelto and Amiodarone and cardiovert in a few weeks. At discharge it was decide to stop his Plavix, his last PCI was in 2006. We'll continue Amiodarone 400mg  TID for 5 days, then decrease to 400mg  BID. On 08/18/11 it was noted the patient was somewhat "sleepy" but eating and drinking OK. This admission he was SOB and had hemoptysis. In the ER his CXR suggest RLL infiltrate/pnuemonia. His PO2 was in the 50s on admission. WBC were WNL and he was afebrile. There is concern he has had a pulmonary bleed on Xarelto vs aspiration pneumonia after TEE.  The patient was admitted  and started on ABX.  Xarelto was stopped and pulmonary consult requested.  Amio was decreased to 400mg  daily. He was started on IV diuretics.  Hgb remained stable.  Zosyn switched to Augmentin.  WBC not elevated.  PT/OT evaluated.  Pt hypotensive 08/23/11.  Metoprolol to 50mg  BID, stopped Diltiazem, and put hold parameters on Imdur and Lasix.  CSW consult for SNF.   Xarelto was resumed. The pt will be dcd in stable condition to [facility].  Consults: PCCM/PT/OT  Significant Diagnostic Studies:  CT CHEST WITHOUT CONTRAST  Findings: Cardiomegaly, heavy coronary artery calcifications and a right-sided pacemaker are identified. Bilateral pleural effusions are identified, small to moderate on the right and very small on the left. There is no evidence of pericardial effusion or enlarged lymph nodes. Moderate centrilobular and paraseptal emphysema are noted.   Right basilar atelectasis is present.  There is no evidence of airspace disease, consolidation, nodule,  discrete mass or endobronchial/endotracheal lesions.   No acute or suspicious bony abnormalities are identified.   The visualized upper abdomen is within normal limits.    IMPRESSION:  Bilateral pleural effusions, small to moderate on the right and very small on the left, with associated right basilar atelectasis.   Moderate centrilobular and paraseptal emphysema.   Cardiomegaly.   08/19/11, PORTABLE CHEST - 1 VIEW  Comparison: 08/11/2011; 05/28/2011; 01/19/2010  Findings:  Examination is minimally degraded secondary to exclusion of the left costophrenic angle. Grossly unchanged enlarged cardiac silhouette and mediastinal contours. Stable position of  support apparatus. Interval development of right basilar heterogeneous air space opacities. There is persistent blunting of the right costophrenic angle. No definite pleural effusion. No pneumothorax. Grossly unchanged bones.  IMPRESSION:  Interval development of right mid and lower lung heterogeneous  air space opacities worrisome for infection and/or aspiration. A follow-up chest radiograph in 4 to 6 weeks after treatment is recommended to ensure resolution.  Treatments: ABX, IV diuretics, PT/OT   Discharge Exam: Blood pressure 121/73, pulse 75, temperature 98.2 F (36.8 C), temperature source Oral, resp. rate 19, height 5\' 9"  (1.753 m), weight 59.784 kg (131 lb 12.8 oz), SpO2 96.00%.   Disposition: 01-Home or Self Care - to Skilled Nursing Facility   Medication List  As of 08/24/2011 11:59 AM   STOP taking these medications         diltiazem 120 MG 24 hr tablet      ICAPS PO         TAKE these medications         acetaminophen 325 MG tablet   Commonly known as: TYLENOL   Take 2 tablets (650 mg total) by mouth every 4 (four) hours as needed.      amiodarone 200 MG tablet   Commonly known as: PACERONE   Take 2 tablets (400 mg total) by mouth 2 (two) times daily.      aspirin 81 MG chewable tablet   Chew 81 mg by mouth daily.      digoxin 0.0625 mg Tabs   Commonly known as: LANOXIN   Take 0.5 tablets (0.0625 mg total) by mouth daily.      diphenoxylate-atropine 2.5-0.025 MG per tablet   Commonly known as: LOMOTIL   Take 1 tablet by mouth 4 (four) times daily.      donepezil 10 MG tablet   Commonly known as: ARICEPT   Take 10 mg by mouth at bedtime as needed.      furosemide 40 MG tablet   Commonly known as: LASIX   Take 1 tablet (40 mg total) by mouth daily.      guaiFENesin-dextromethorphan 100-10 MG/5ML syrup   Commonly known as: ROBITUSSIN DM   Take 5 mLs by mouth every 4 (four) hours as needed for cough.      isosorbide mononitrate 15 mg Tb24   Commonly known as: IMDUR   Take 1 tablet (30 mg total) by mouth daily.      levalbuterol 0.63 MG/3ML nebulizer solution   Commonly known as: XOPENEX   Take 3 mLs (0.63 mg total) by nebulization every 6 (six) hours as needed for wheezing or shortness of breath.      metoprolol 100 MG tablet   Commonly known  as: LOPRESSOR   Take 0.5 tablets (50 mg total) by mouth 2 (two) times daily.      multivitamins ther. w/minerals Tabs   Take 1 tablet by mouth daily.      nitroGLYCERIN 0.4 MG SL tablet   Commonly known as: NITROSTAT   Place 1 tablet (0.4 mg total) under the tongue every 5 (five) minutes x 3 doses as needed for chest pain.      pantoprazole 40 MG tablet   Commonly known as: PROTONIX   Take 1 tablet (40 mg total) by mouth 2 (two) times daily with a meal.      potassium chloride 10 MEQ tablet   Commonly known as: K-DUR,KLOR-CON   Take 1 tablet (10 mEq total) by mouth daily.      Rivaroxaban 15  MG Tabs tablet   Commonly known as: XARELTO   Take 1 tablet (15 mg total) by mouth daily.           Follow-up Information    Follow up with Juline Patch, MD.      Follow up with Runell Gess, MD on 09/05/2011. (at  9:45 am, we cancelled the 09/12/11 appt.)    Contact information:   [address] [place] [address] [phone]       Follow up with [facility], MD. (Our office will call with date, time.)    Contact information:   [address] [place] [address] [phone]          Signed: Dwana Melena 08/24/2011, 11:59 AM  I did see & examine the patient on the day of discharge.  He is stable for discharge. BP med doses were decreased to avoid hypotension.   Marykay Lex, M.D., M.S. [facility] [address] [place], [place]  [address]  [phone] Pager # [phone]  08/24/2011 1:55 PM

## 2011-09-12 ENCOUNTER — Ambulatory Visit (INDEPENDENT_AMBULATORY_CARE_PROVIDER_SITE_OTHER)
Admission: RE | Admit: 2011-09-12 | Discharge: 2011-09-12 | Disposition: A | Discharge status: Home / Self Care | Payer: Medicare Other | Source: Ambulatory Visit | Attending: Internal Medicine | Admitting: Internal Medicine

## 2011-09-12 ENCOUNTER — Encounter: Payer: Self-pay | Admitting: Internal Medicine

## 2011-09-12 ENCOUNTER — Ambulatory Visit (INDEPENDENT_AMBULATORY_CARE_PROVIDER_SITE_OTHER): Payer: Medicare Other | Admitting: Internal Medicine

## 2011-09-12 VITALS — BP 104/52 | HR 65 | Temp 97.9°F | Ht 68.0 in | Wt 133.2 lb

## 2011-09-12 DIAGNOSIS — R0609 Other forms of dyspnea: Secondary | ICD-10-CM

## 2011-09-12 DIAGNOSIS — R0600 Dyspnea, unspecified: Secondary | ICD-10-CM

## 2011-09-12 DIAGNOSIS — R0989 Other specified symptoms and signs involving the circulatory and respiratory systems: Secondary | ICD-10-CM

## 2011-09-12 DIAGNOSIS — R05 Cough: Secondary | ICD-10-CM

## 2011-09-12 NOTE — Patient Instructions (Signed)
Please remember to go to the lab and x-ray department downstairs for your tests - we will call you with the results when they are available.    Please see patient coordinator before you leave today  to schedule sinus CT  Best cough medication is mucinex dm 2 every 12 hours as needed   Need to return to this clinic within a week of discharge with all medications and medication lists either to see Dr Sherene Sires or Babette Relic NP

## 2011-09-12 NOTE — Progress Notes (Signed)
Subjective:     Patient ID: Johnathan [name], male   DOB: 11/24/1915   MRN: [id number]  HPI Admit date: 08/19/2011  Discharge date: 08/24/2011  Admission Diagnoses: Respiratory failure  Discharge Diagnoses:  Principal Problem:  *Respiratory failure, on admit, now resolved  Active Problems:  Atrial fibrillation, s/p recent failed TEE/CV, 08/15/2011, on AMIO and Xarelto  CAD, LAD stent 2003, 2006, patent with distal disease by cath 08/13/11  HTN (hypertension)  Pacemaker, St Jude 2006  Cardiomyopathy, EF 45%  Hemoptysis, from chronic cough in combination with Xarelto was minimal and now resolved  Hypoxia, PO2 50s    Hospital Course:  78 y/o with a history of CAD, PAF/ SSS/ s/p PTVDP. He was recently seen by his primary MD and was noted to have a rapid HR. He was referred to our office and seen by Dr Allyson Sabal. EKG and pacer interrogation reviewed by Dr Allyson Sabal and Dr Royann Shivers and it was felt the pt had previously been in NSR so the AF recurrence was new. Dr Allyson Sabal adjusted his medications for rate control and added Xarelto. Unfortunately the pt's lab came back abnormal with an elevated Troponin and he was then admitted for heparin and set up for diagnostic cath. This was done 3/18 and the plan is for continued medical Rx. We attempted a TEE/CV 3/20 but this was unnsuccessful. The plan now is to add Xarelto and Amiodarone and cardiovert in a few weeks. At discharge it was decide to stop his Plavix, his last PCI was in 2006. We'll continue Amiodarone 400mg  TID for 5 days, then decrease to 400mg  BID. On 08/18/11 it was noted the patient was somewhat "sleepy" but eating and drinking OK. This admission he was SOB and had hemoptysis. In the ER his CXR suggest RLL infiltrate/pnuemonia. His PO2 was in the 50s on admission. WBC were WNL and he was afebrile. There is concern he has had a pulmonary bleed on Xarelto vs aspiration pneumonia after TEE. The patient was admitted and started on ABX. Xarelto was stopped and  pulmonary consult requested. Amio was decreased to 400mg  daily. He was started on IV diuretics. Hgb remained stable. Zosyn switched to Augmentin. WBC not elevated. PT/OT evaluated. Pt hypotensive 08/23/11. Metoprolol to 50mg  BID, stopped Diltiazem, and put hold parameters on Imdur and Lasix. CSW consult for SNF. Xarelto was resumed. The pt will be dcd in stable condition to [facility].  Consults: PCCM/PT/OT  Significant Diagnostic Studies:  CT CHEST WITHOUT CONTRAST  Findings: Cardiomegaly, heavy coronary artery calcifications and a right-sided pacemaker are identified. Bilateral pleural effusions are identified, small to moderate on the right and very small on the left. There is no evidence of pericardial effusion or enlarged lymph nodes. Moderate centrilobular and paraseptal emphysema are noted. Right basilar atelectasis is present. There is no evidence of airspace disease, consolidation, nodule,  discrete mass or endobronchial/endotracheal lesions. No acute or suspicious bony abnormalities are identified. The visualized upper abdomen is within normal limits.  IMPRESSION:  Bilateral pleural effusions, small to moderate on the right and very small on the left, with associated right basilar atelectasis. Moderate centrilobular and paraseptal emphysema. Cardiomegaly.  09/12/2011 Post hosp f/u ov/ Ezme Duch cc much better since discharge, still sob with activities > room to room, minimal slt yellow mucus, min chest pain L lat with coughing but overall all problems 76% or more improved vs admit. No perceived need for daytime saba. Swallowing ok.  Sleeping ok without nocturnal  or early am exacerbation  of respiratory  c/o's or need for noct saba. Also denies any obvious fluctuation of symptoms with weather or environmental changes or other aggravating or alleviating factors except as outlined above   ROS  At present neg for  any significant sore throat, dysphagia, dental problems, itching, sneezing,  nasal  congestion or excess/ purulent secretions, ear ache,   fever, chills, sweats, unintended wt loss, pleuritic or exertional cp, hemoptysis, palpitations, orthopnea pnd or leg swelling.  Also denies presyncope, palpitations, heartburn, abdominal pain, anorexia, nausea, vomiting, diarrhea  or change in bowel or urinary habits, change in stools or urine, dysuria,hematuria,  rash, arthralgias, visual complaints, headache, numbness weakness or ataxia or problems with walking or coordination. No noted change in mood/affect or memory.                     Review of Systems     Objective:   Physical Exam  Frail elderly male with nasal one to voice.   09/12/2011 133  HEENT: nl dentition, turbinates, and orophanx. Nl external ear canals without cough reflex   NECK :  without JVD/Nodes/TM/ nl carotid upstrokes bilaterally   LUNGS: no acc muscle use, clear to A and P bilaterally without cough on insp or exp maneuvers   CV:  RRR  no s3 or murmur or increase in P2, no edema   ABD:  soft and nontender with nl excursion in the supine position. No bruits or organomegaly, bowel sounds nl  MS:  warm without deformities, calf tenderness, cyanosis or clubbing  SKIN: warm and dry without lesions    NEURO:  alert, approp, no deficits    CXR  09/12/2011 :   Resolution of bibasilar air space opacities and pleural effusions. Stable underlying emphysematous changes.  Labs rec 09/12/11 not done   Assessment:         Plan:

## 2011-09-14 NOTE — Progress Notes (Signed)
Quick Note:  Spoke with pt and notified of results per Dr. Wert. Pt verbalized understanding and denied any questions.  ______ 

## 2011-09-15 NOTE — Assessment & Plan Note (Addendum)
The most common causes of chronic cough in immunocompetent adults include the following: upper airway cough syndrome (UACS), previously referred to as postnasal drip syndrome (PNDS), which is caused by variety of rhinosinus conditions; (2) asthma; (3) GERD; (4) chronic bronchitis from cigarette smoking or other inhaled environmental irritants; (5) nonasthmatic eosinophilic bronchitis; and (6) bronchiectasis.   These conditions, singly or in combination, have accounted for up to 94% of the causes of chronic cough in prospective studies.   Other conditions have constituted no >6% of the causes in prospective studies These have included bronchogenic carcinoma, chronic interstitial pneumonia, sarcoidosis, left ventricular failure, ACEI-induced cough, and aspiration from a condition associated with pharyngeal dysfunction.   Chronic cough is often simultaneously caused by more than one condition. A single cause has been found from 38 to 82% of the time, multiple causes from 18 to 62%. Multiply caused cough has been the result of three diseases up to 42% of the time.      Overall much improved with no need for any med x mucinex dm prn and f/u here for med reconciliation / longterm rx once is discharged from [facility]

## 2011-11-29 ENCOUNTER — Emergency Department (HOSPITAL_COMMUNITY): Payer: Medicare Other

## 2011-11-29 ENCOUNTER — Emergency Department (HOSPITAL_COMMUNITY)
Admission: EM | Admit: 2011-11-29 | Discharge: 2011-11-29 | Disposition: A | Discharge status: Home / Self Care | Payer: Medicare Other | Attending: Emergency Medicine | Admitting: Emergency Medicine

## 2011-11-29 ENCOUNTER — Encounter (HOSPITAL_COMMUNITY): Payer: Self-pay | Admitting: *Deleted

## 2011-11-29 DIAGNOSIS — Z950 Presence of cardiac pacemaker: Secondary | ICD-10-CM | POA: Insufficient documentation

## 2011-11-29 DIAGNOSIS — Z9861 Coronary angioplasty status: Secondary | ICD-10-CM | POA: Insufficient documentation

## 2011-11-29 DIAGNOSIS — I252 Old myocardial infarction: Secondary | ICD-10-CM | POA: Insufficient documentation

## 2011-11-29 DIAGNOSIS — Z87891 Personal history of nicotine dependence: Secondary | ICD-10-CM | POA: Insufficient documentation

## 2011-11-29 DIAGNOSIS — M129 Arthropathy, unspecified: Secondary | ICD-10-CM | POA: Insufficient documentation

## 2011-11-29 DIAGNOSIS — N400 Enlarged prostate without lower urinary tract symptoms: Secondary | ICD-10-CM | POA: Insufficient documentation

## 2011-11-29 DIAGNOSIS — I2510 Atherosclerotic heart disease of native coronary artery without angina pectoris: Secondary | ICD-10-CM | POA: Insufficient documentation

## 2011-11-29 DIAGNOSIS — M549 Dorsalgia, unspecified: Secondary | ICD-10-CM | POA: Insufficient documentation

## 2011-11-29 MED ORDER — TRAMADOL HCL 50 MG PO TABS: 50.0000 mg | ORAL_TABLET | Freq: Four times a day (QID) | ORAL | Status: AC | PRN
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
## 2012-03-28 DIAGNOSIS — I509 Heart failure, unspecified: Secondary | ICD-10-CM

## 2012-03-28 HISTORY — DX: Heart failure, unspecified: I50.9

## 2012-10-29 ENCOUNTER — Other Ambulatory Visit: Payer: Self-pay | Admitting: Cardiovascular Disease

## 2012-10-29 ENCOUNTER — Ambulatory Visit (INDEPENDENT_AMBULATORY_CARE_PROVIDER_SITE_OTHER): Payer: Medicare Other | Admitting: Cardiovascular Disease

## 2012-10-29 VITALS — BP 136/60 | HR 60 | Resp 20 | Ht 69.0 in | Wt 140.7 lb

## 2012-10-29 DIAGNOSIS — I4891 Unspecified atrial fibrillation: Secondary | ICD-10-CM

## 2012-10-29 DIAGNOSIS — Z950 Presence of cardiac pacemaker: Secondary | ICD-10-CM

## 2012-10-29 DIAGNOSIS — F0390 Unspecified dementia without behavioral disturbance: Secondary | ICD-10-CM

## 2012-10-29 DIAGNOSIS — I2510 Atherosclerotic heart disease of native coronary artery without angina pectoris: Secondary | ICD-10-CM

## 2012-10-29 DIAGNOSIS — I255 Ischemic cardiomyopathy: Secondary | ICD-10-CM

## 2012-10-29 DIAGNOSIS — I2589 Other forms of chronic ischemic heart disease: Secondary | ICD-10-CM

## 2012-10-29 NOTE — Patient Instructions (Signed)
Your physician recommends that you schedule a follow-up appointment in: 6 months with Dr.Croitoru, and 3 months device clinic

## 2012-10-29 NOTE — Progress Notes (Signed)
In office pacemaker interrogation. Normal device function. No changes made this session. 

## 2012-10-30 LAB — PACEMAKER DEVICE OBSERVATION
AL AMPLITUDE: 0.5 mv
AL IMPEDENCE PM: 360 Ohm
BAMS-0001: 140 {beats}/min
BATTERY VOLTAGE: 2.79 V
RV LEAD AMPLITUDE: 11.5 mv
RV LEAD IMPEDENCE PM: 599 Ohm

## 2012-11-02 ENCOUNTER — Encounter: Payer: Self-pay | Admitting: Cardiovascular Disease

## 2012-11-02 DIAGNOSIS — F0390 Unspecified dementia without behavioral disturbance: Secondary | ICD-10-CM | POA: Insufficient documentation

## 2012-11-02 NOTE — Assessment & Plan Note (Addendum)
Most recently left ventricular ejection fraction was estimated to be 45% by echo in March of last year. Clinically he does not have any manifestations of decompensated congestive heart failure at this time (NYHA class II) and appears to be euvolemic whilst on therapy with loop diuretics. We are trying to avoid polypharmacy in this elderly gentleman with progressive dementia: he is not on ACE inhibitors or beta blockers.

## 2012-11-02 NOTE — Progress Notes (Signed)
Patient ID: Johnathan [name], male   DOB: 1915/07/22, 79 y.o.   MRN: [id number]      Reason for office visit As the heat generally feels well. He is in very good spirits today and apparently has been so for the last several months. He is now almost 79 years old and his memory problems are slowly worsening. He has not had any recent issues with chest pain or dyspnea either at rest or with exertion. He wears oxygen 3 L continuously. He has had a few episodes of atrial fibrillation detected by his pacemaker but has been unaware of them. He is no longer on full anticoagulation therapy due to problems with hemoptysis frailty and easy falling. Is not had any new focal neurological deficits to suggest a cardioembolic event. He does not have lower showed edema or symptoms of shortness of breath as long as he wears his oxygen.    Allergies  Allergen Reactions  . Aspirin     Has heart problems    Current Outpatient Prescriptions  Medication Sig Dispense Refill  . amiodarone (PACERONE) 200 MG tablet Take 100 mg by mouth daily.       Marland Kitchen aspirin 81 MG chewable tablet Chew 162 mg by mouth daily.       Marland Kitchen donepezil (ARICEPT) 10 MG tablet Take 10 mg by mouth every morning.       . furosemide (LASIX) 20 MG tablet Take 40 mg by mouth daily.       Marland Kitchen levothyroxine (SYNTHROID, LEVOTHROID) 75 MCG tablet Take 75 mcg by mouth daily.      . potassium chloride (K-DUR,KLOR-CON) 10 MEQ tablet Take 1 tablet (10 mEq total) by mouth daily.  30 tablet  5  . isosorbide mononitrate (IMDUR) 15 mg TB24 Take 30 mg by mouth daily.      . metoprolol (LOPRESSOR) 100 MG tablet Take 50 mg by mouth 2 (two) times daily.       No current facility-administered medications for this visit.    Past Medical History  Diagnosis Date  . Myocardial infarct 2003    LAD stent  . Coronary artery disease 2006    re do LAD stent  . Bradycardia 2006    St Jude pacemaker  . BPH (benign prostatic hyperplasia)   . Dysrhythmia   . Pacemaker     . Shortness of breath   . Arthritis     Past Surgical History  Procedure Laterality Date  . Pacemaker insertion    . Appendectomy    . Cardiac catheterization    . Insert / replace / remove pacemaker    . Tee without cardioversion  08/15/2011    Procedure: TRANSESOPHAGEAL ECHOCARDIOGRAM (TEE);  Surgeon: [name], MD;  Location: [facility] ENDOSCOPY;  Service: Cardiovascular;  Laterality: N/A;  . Cardioversion  08/15/2011    Procedure: CARDIOVERSION;  Surgeon: [name], MD;  Location: [facility] ENDOSCOPY;  Service: Cardiovascular;  Laterality: N/A;    No family history on file.  History   Social History  . Marital Status: Widowed    Spouse Name: N/A    Number of Children: N/A  . Years of Education: N/A   Occupational History  . Not on file.   Social History Main Topics  . Smoking status: Former Smoker    Quit date: 08/09/1973  . Smokeless tobacco: Not on file  . Alcohol Use: 8.4 oz/week    14 Glasses of wine per week  . Drug Use: No  . Sexually  Active:    Other Topics Concern  . Not on file   Social History Narrative  . No narrative on file    Review of systems: He becomes short of breath he does not wear his oxygen on he exerts himself excessively. As long as he wears oxygen she has functional class II symptoms. He does not have angina pectoris and denies syncope palpitations lightheadedness, dizziness, low sugar edema, abdominal pain, change in bowel habits, gastrointestinal bleeding, nausea, vomiting, dysphagia, hematuria, polyuria, polydipsia, major weight changes, focal..., doing claudication  PHYSICAL EXAM BP 136/60  Pulse 60  Resp 20  Ht 5\' 9"  (1.753 m)  Wt 63.821 kg (140 lb 11.2 oz)  BMI 20.77 kg/m2  General: Alert, oriented x3, no distress Head: no evidence of trauma, PERRL, EOMI, no exophtalmos or lid lag, no myxedema, no xanthelasma; normal ears, nose and oropharynx Neck: normal jugular venous pulsations and no hepatojugular reflux; brisk carotid  pulses without delay and no carotid bruits Chest: clear to auscultation, no signs of consolidation by percussion or palpation, normal fremitus, symmetrical and full respiratory excursions; healthy pacemaker site Cardiovascular: normal position and quality of the apical impulse, regular rhythm, normal first and  second heart sounds, no murmurs, rubs or gallops Abdomen: no tenderness or distention, no masses by palpation, no abnormal pulsatility or arterial bruits, normal bowel sounds, no hepatosplenomegaly Extremities: no clubbing, cyanosis or edema; 2+ radial, ulnar and brachial pulses bilaterally; 2+ right femoral, posterior tibial and dorsalis pedis pulses; 2+ left femoral, posterior tibial and dorsalis pedis pulses; no subclavian or femoral bruits are approximately split Neurological: grossly nonfocal   EKG: Atrial ventricular sequential pacing  Lipid Panel  No results found for this basename: chol, trig, hdl, cholhdl, vldl, ldlcalc    BMET    Component Value Date/Time   NA 144 08/24/2011 0645   K 3.7 08/24/2011 0645   CL 99 08/24/2011 0645   CO2 34* 08/24/2011 0645   GLUCOSE 97 08/24/2011 0645   BUN 18 08/24/2011 0645   CREATININE 0.70 08/24/2011 0645   CALCIUM 9.0 08/24/2011 0645   GFRNONAA 79* 08/24/2011 0645   GFRAA >90 08/24/2011 0645     ASSESSMENT AND PLAN Atrial fibrillation, persistent Interrogation of his pacemaker shows a very low burden of atrial fibrillation (only 2.6%). The longest episode lasted for about 2 hours in February; rate control has really been good during the episodes of atrial fibrillation. Prolonged atrial fibrillation last year was poorly tolerated and he failed an attempt at cardioversion. After amiodarone was added he has successfully maintained sinus rhythm and has greatly improved from a clinical standpoint amiodarone should be continued at the lowest effective dose. He will require yearly pulmonary function tests and twice yearly liver and thyroid function  tests. He had hemoptysis while on treatment with river rocks at them and is felt to be at high risk for falls. For this reason he is only on aspirin for stroke prophylaxis.  Cardiomyopathy, ischemic Most recently left ventricular ejection fraction was estimated to be 45% by echo in March of last year. Clinically he does not have any manifestations of decompensated congestive heart failure at this time (NYHA class II) and appears to be euvolemic whilst on therapy with loop diuretics. We are trying to avoid polypharmacy in this elderly gentleman with progressive dementia: he is not on ACE inhibitors or beta blockers.  CAD, LAD stent 2003, 2006, patent with distal disease by cath 08/13/11 He is free of angina. He underwent a nuclear stress test  in January of 2013 that showed a mild perfusion defect due to infarct with mild peri-infarct ischemia in the apex. Cardiac catheterization did not show any opportunities for revascularization: The LAD stent was widely patent but there was severe distal disease in both the main vessel and the diagonal branches. Minor nonobstructive lesions are seen in the other coronary territories  Pacemaker, [facility] [address] Jude [place] model #1610 serial number [id number] implanted November 2006 for symptomatic second degree atrial ventricular block. The device is functioning normally. There are probably 2-3 years expected on the battery. There 78% atrial pacing at 81% ventricular pacing. The underlying rhythm is sinus bradycardia 50 beats per minute. As mentioned there has been overall burden of atrial for relation of 2.6%. Please refer to the separate pacemaker note for lead parameters and other details.  Dementia His memory continues to deteriorate despite treatment with Aricept  Orders Placed This Encounter  Procedures  . EKG 12-Lead   Meds ordered this encounter  Medications  . levothyroxine (SYNTHROID, LEVOTHROID) 75 MCG tablet    Sig: Take 75 mcg by  mouth daily.    Junious Silk, MD, [facility] [phone] office [phone] pager

## 2012-11-02 NOTE — Assessment & Plan Note (Signed)
He is free of angina. He underwent a nuclear stress test in January of 2013 that showed a mild perfusion defect due to infarct with mild peri-infarct ischemia in the apex. Cardiac catheterization did not show any opportunities for revascularization: The LAD stent was widely patent but there was severe distal disease in both the main vessel and the diagonal branches. Minor nonobstructive lesions are seen in the other coronary territories

## 2012-11-02 NOTE — Assessment & Plan Note (Signed)
Dual-chamber [address] Jude [place] model #9604 serial number [id number] implanted November 2006 for symptomatic second degree atrial ventricular block. The device is functioning normally. There are probably 2-3 years expected on the battery. There 78% atrial pacing at 81% ventricular pacing. The underlying rhythm is sinus bradycardia 50 beats per minute. As mentioned there has been overall burden of atrial for relation of 2.6%. Please refer to the separate pacemaker note for lead parameters and other details.

## 2012-11-02 NOTE — Assessment & Plan Note (Addendum)
Interrogation of his pacemaker shows a very low burden of atrial fibrillation (only 2.6%). The longest episode lasted for about 2 hours in February; rate control has really been good during the episodes of atrial fibrillation. Prolonged atrial fibrillation last year was poorly tolerated and he failed an attempt at cardioversion. After amiodarone was added he has successfully maintained sinus rhythm and has greatly improved from a clinical standpoint amiodarone should be continued at the lowest effective dose. He will require yearly pulmonary function tests and twice yearly liver and thyroid function tests. He had hemoptysis while on treatment with river rocks at them and is felt to be at high risk for falls. For this reason he is only on aspirin for stroke prophylaxis.

## 2012-11-02 NOTE — Assessment & Plan Note (Signed)
His memory continues to deteriorate despite treatment with Aricept

## 2012-11-06 ENCOUNTER — Telehealth: Payer: Self-pay | Admitting: Cardiology

## 2012-11-06 NOTE — Telephone Encounter (Signed)
Been waiting since Monday for his medicine-Please call his Pacerone  To [facility]-[phone]-Please call instead of  sending it by electronic!

## 2012-11-07 ENCOUNTER — Encounter: Payer: Self-pay | Admitting: Cardiovascular Disease

## 2012-11-07 MED ORDER — AMIODARONE HCL 200 MG PO TABS: 100.0000 mg | ORAL_TABLET | Freq: Every day | ORAL | Status: DC

## 2013-01-21 ENCOUNTER — Emergency Department (HOSPITAL_COMMUNITY): Payer: Medicare Other

## 2013-01-21 ENCOUNTER — Encounter (HOSPITAL_COMMUNITY): Payer: Self-pay | Admitting: Nurse Practitioner

## 2013-01-21 ENCOUNTER — Observation Stay (HOSPITAL_COMMUNITY)
Admission: EM | Admit: 2013-01-21 | Discharge: 2013-01-23 | Disposition: A | Discharge status: Home / Self Care | Payer: Medicare Other | Attending: Cardiology | Admitting: Cardiology

## 2013-01-21 DIAGNOSIS — I10 Essential (primary) hypertension: Secondary | ICD-10-CM | POA: Insufficient documentation

## 2013-01-21 DIAGNOSIS — I2510 Atherosclerotic heart disease of native coronary artery without angina pectoris: Secondary | ICD-10-CM | POA: Insufficient documentation

## 2013-01-21 DIAGNOSIS — R05 Cough: Secondary | ICD-10-CM | POA: Diagnosis present

## 2013-01-21 DIAGNOSIS — I480 Paroxysmal atrial fibrillation: Secondary | ICD-10-CM | POA: Diagnosis present

## 2013-01-21 DIAGNOSIS — I2589 Other forms of chronic ischemic heart disease: Secondary | ICD-10-CM

## 2013-01-21 DIAGNOSIS — R000 Tachycardia, unspecified: Secondary | ICD-10-CM | POA: Insufficient documentation

## 2013-01-21 DIAGNOSIS — I200 Unstable angina: Secondary | ICD-10-CM | POA: Diagnosis present

## 2013-01-21 DIAGNOSIS — J4489 Other specified chronic obstructive pulmonary disease: Secondary | ICD-10-CM | POA: Insufficient documentation

## 2013-01-21 DIAGNOSIS — R059 Cough, unspecified: Secondary | ICD-10-CM | POA: Insufficient documentation

## 2013-01-21 DIAGNOSIS — I255 Ischemic cardiomyopathy: Secondary | ICD-10-CM | POA: Diagnosis present

## 2013-01-21 DIAGNOSIS — N400 Enlarged prostate without lower urinary tract symptoms: Secondary | ICD-10-CM | POA: Diagnosis present

## 2013-01-21 DIAGNOSIS — I4891 Unspecified atrial fibrillation: Secondary | ICD-10-CM | POA: Insufficient documentation

## 2013-01-21 DIAGNOSIS — R079 Chest pain, unspecified: Secondary | ICD-10-CM | POA: Insufficient documentation

## 2013-01-21 DIAGNOSIS — R042 Hemoptysis: Secondary | ICD-10-CM | POA: Diagnosis present

## 2013-01-21 DIAGNOSIS — F0390 Unspecified dementia without behavioral disturbance: Secondary | ICD-10-CM | POA: Diagnosis present

## 2013-01-21 DIAGNOSIS — R7989 Other specified abnormal findings of blood chemistry: Secondary | ICD-10-CM

## 2013-01-21 DIAGNOSIS — Z950 Presence of cardiac pacemaker: Secondary | ICD-10-CM | POA: Diagnosis present

## 2013-01-21 DIAGNOSIS — I214 Non-ST elevation (NSTEMI) myocardial infarction: Principal | ICD-10-CM | POA: Insufficient documentation

## 2013-01-21 DIAGNOSIS — J449 Chronic obstructive pulmonary disease, unspecified: Secondary | ICD-10-CM | POA: Diagnosis present

## 2013-01-21 HISTORY — DX: Angina pectoris, unspecified: I20.9

## 2013-01-21 HISTORY — DX: Essential (primary) hypertension: I10

## 2013-01-21 HISTORY — DX: Unspecified dementia, unspecified severity, without behavioral disturbance, psychotic disturbance, mood disturbance, and anxiety: F03.90

## 2013-01-21 LAB — POCT I-STAT TROPONIN I

## 2013-01-21 LAB — CBC
HCT: 34.4 % — ABNORMAL LOW (ref 39.0–52.0)
MCH: 34.9 pg — ABNORMAL HIGH (ref 26.0–34.0)
MCV: 100.9 fL — ABNORMAL HIGH (ref 78.0–100.0)
Platelets: 136 10*3/uL — ABNORMAL LOW (ref 150–400)
RBC: 3.41 MIL/uL — ABNORMAL LOW (ref 4.22–5.81)

## 2013-01-21 LAB — CBC WITH DIFFERENTIAL/PLATELET
Eosinophils Relative: 2 % (ref 0–5)
HCT: 36.8 % — ABNORMAL LOW (ref 39.0–52.0)
Hemoglobin: 12.6 g/dL — ABNORMAL LOW (ref 13.0–17.0)
Lymphocytes Relative: 17 % (ref 12–46)
Lymphs Abs: 0.8 10*3/uL (ref 0.7–4.0)
MCV: 100.5 fL — ABNORMAL HIGH (ref 78.0–100.0)
Monocytes Absolute: 0.7 10*3/uL (ref 0.1–1.0)
RBC: 3.66 MIL/uL — ABNORMAL LOW (ref 4.22–5.81)
WBC: 4.8 10*3/uL (ref 4.0–10.5)

## 2013-01-21 LAB — PROTIME-INR
INR: 1.04 (ref 0.00–1.49)
Prothrombin Time: 13.4 seconds (ref 11.6–15.2)

## 2013-01-21 LAB — BASIC METABOLIC PANEL
CO2: 30 mEq/L (ref 19–32)
Calcium: 8.5 mg/dL (ref 8.4–10.5)
Chloride: 97 mEq/L (ref 96–112)
Creatinine, Ser: 0.92 mg/dL (ref 0.50–1.35)
Glucose, Bld: 89 mg/dL (ref 70–99)

## 2013-01-21 LAB — APTT: aPTT: 36 seconds (ref 24–37)

## 2013-01-21 LAB — HEPATIC FUNCTION PANEL
ALT: 15 U/L (ref 0–53)
AST: 42 U/L — ABNORMAL HIGH (ref 0–37)
Alkaline Phosphatase: 58 U/L (ref 39–117)
Indirect Bilirubin: 0.3 mg/dL (ref 0.3–0.9)
Total Protein: 5.7 g/dL — ABNORMAL LOW (ref 6.0–8.3)

## 2013-01-21 MED ORDER — AMIODARONE HCL 200 MG PO TABS
200.0000 mg | ORAL_TABLET | Freq: Two times a day (BID) | ORAL | Status: DC
  Administered 2013-01-22 – 2013-01-23 (×3): 200 mg via ORAL
  Filled 2013-01-21 (×6): qty 1

## 2013-01-21 MED ORDER — AMIODARONE HCL 200 MG PO TABS: 400.0000 mg | ORAL_TABLET | Freq: Every day | ORAL | Status: DC

## 2013-01-21 MED ORDER — ACETAMINOPHEN 325 MG PO TABS: 650.0000 mg | ORAL_TABLET | ORAL | Status: DC | PRN

## 2013-01-21 MED ORDER — HEPARIN SODIUM (PORCINE) 5000 UNIT/ML IJ SOLN
5000.0000 [IU] | Freq: Three times a day (TID) | INTRAMUSCULAR | Status: DC
  Administered 2013-01-21 – 2013-01-23 (×5): 5000 [IU] via SUBCUTANEOUS
  Filled 2013-01-21 (×8): qty 1

## 2013-01-21 MED ORDER — ISOSORBIDE MONONITRATE ER 30 MG PO TB24
30.0000 mg | ORAL_TABLET | Freq: Every day | ORAL | Status: DC
  Filled 2013-01-21: qty 1

## 2013-01-21 MED ORDER — ASPIRIN EC 81 MG PO TBEC
81.0000 mg | DELAYED_RELEASE_TABLET | Freq: Every day | ORAL | Status: DC
  Administered 2013-01-23: 81 mg via ORAL
  Filled 2013-01-21 (×2): qty 1

## 2013-01-21 MED ORDER — DONEPEZIL HCL 10 MG PO TABS
10.0000 mg | ORAL_TABLET | Freq: Every day | ORAL | Status: DC
  Administered 2013-01-22: 10 mg via ORAL
  Filled 2013-01-21 (×3): qty 1

## 2013-01-21 MED ORDER — ASPIRIN 81 MG PO CHEW
162.0000 mg | CHEWABLE_TABLET | Freq: Every day | ORAL | Status: DC
  Administered 2013-01-22 – 2013-01-23 (×2): 162 mg via ORAL
  Filled 2013-01-21: qty 1
  Filled 2013-01-21: qty 2
  Filled 2013-01-21: qty 1

## 2013-01-21 MED ORDER — POTASSIUM CHLORIDE CRYS ER 10 MEQ PO TBCR
10.0000 meq | EXTENDED_RELEASE_TABLET | Freq: Every day | ORAL | Status: DC
  Administered 2013-01-22 – 2013-01-23 (×2): 10 meq via ORAL
  Filled 2013-01-21 (×2): qty 1

## 2013-01-21 MED ORDER — SODIUM CHLORIDE 0.9 % IJ SOLN
3.0000 mL | Freq: Two times a day (BID) | INTRAMUSCULAR | Status: DC
  Administered 2013-01-21 – 2013-01-23 (×4): 3 mL via INTRAVENOUS

## 2013-01-21 MED ORDER — BENZONATATE 100 MG PO CAPS
100.0000 mg | ORAL_CAPSULE | Freq: Three times a day (TID) | ORAL | Status: DC | PRN
  Administered 2013-01-22 – 2013-01-23 (×2): 100 mg via ORAL
  Filled 2013-01-21 (×3): qty 1

## 2013-01-21 MED ORDER — FUROSEMIDE 40 MG PO TABS
40.0000 mg | ORAL_TABLET | Freq: Every day | ORAL | Status: DC
  Administered 2013-01-22: 40 mg via ORAL
  Filled 2013-01-21 (×2): qty 1

## 2013-01-21 MED ORDER — LEVOTHYROXINE SODIUM 75 MCG PO TABS
75.0000 ug | ORAL_TABLET | Freq: Every day | ORAL | Status: DC
  Administered 2013-01-22 – 2013-01-23 (×2): 75 ug via ORAL
  Filled 2013-01-21 (×3): qty 1

## 2013-01-21 MED ORDER — METOPROLOL TARTRATE 50 MG PO TABS
50.0000 mg | ORAL_TABLET | Freq: Two times a day (BID) | ORAL | Status: DC
  Administered 2013-01-22 – 2013-01-23 (×3): 50 mg via ORAL
  Filled 2013-01-21 (×5): qty 1

## 2013-01-21 MED ORDER — SODIUM CHLORIDE 0.9 % IV SOLN: 250.0000 mL | INTRAVENOUS | Status: DC | PRN

## 2013-01-21 MED ORDER — MORPHINE SULFATE 4 MG/ML IJ SOLN
4.0000 mg | Freq: Once | INTRAMUSCULAR | Status: AC
  Administered 2013-01-21: 4 mg via INTRAVENOUS
  Filled 2013-01-21: qty 1

## 2013-01-21 MED ORDER — ASPIRIN 81 MG PO CHEW
162.0000 mg | CHEWABLE_TABLET | Freq: Once | ORAL | Status: AC
  Administered 2013-01-21: 162 mg via ORAL
  Filled 2013-01-21: qty 2

## 2013-01-21 MED ORDER — ONDANSETRON HCL 4 MG/2ML IJ SOLN: 4.0000 mg | Freq: Four times a day (QID) | INTRAMUSCULAR | Status: DC | PRN

## 2013-01-21 MED ORDER — SODIUM CHLORIDE 0.9 % IJ SOLN: 3.0000 mL | INTRAMUSCULAR | Status: DC | PRN

## 2013-01-21 MED ORDER — LEVALBUTEROL HCL 0.63 MG/3ML IN NEBU: 0.6300 mg | INHALATION_SOLUTION | Freq: Four times a day (QID) | RESPIRATORY_TRACT | Status: DC | PRN

## 2013-01-21 MED ORDER — NITROGLYCERIN 0.4 MG SL SUBL: 0.4000 mg | SUBLINGUAL_TABLET | SUBLINGUAL | Status: DC | PRN

## 2013-01-21 MED ORDER — SODIUM CHLORIDE 0.9 % IV BOLUS (SEPSIS)
1000.0000 mL | Freq: Once | INTRAVENOUS | Status: AC
  Administered 2013-01-21: 1000 mL via INTRAVENOUS

## 2013-01-21 NOTE — ED Notes (Signed)
Cardiology at bedside.

## 2013-01-21 NOTE — ED Notes (Signed)
Per EMS pt has intermittent episodes of chest pressure with RVR rapid a-fib. Episodes typically last about 2-4 hours and resolves itself. Has had 162ASA, 1 nitro en route with some relief. EKG- rapid a.fib, with frequent PVCs. Pt has pacemaker

## 2013-01-21 NOTE — ED Provider Notes (Signed)
This is a 79 year old who presents with chest pain and tachycardia. EMS reported atrial fibrillation with RVR in route. Patient was given aspirin. Patient reports dull left-sided chest pain since this morning. He denies any shortness of breath or diaphoresis. He does endorse a productive cough as well without any fevers. Patient denies any other recent illnesses. Initial EKG showed sinus tachycardia with a rate of 132, he has ST depression in V4 in V5. No ST elevation noted. Repeat EKG 10 minutes later continues to show sinus tachycardia with a rate of 140, continue ST depressions in V4 and V5 without any dynamic changes noted. An i-STAT troponin was obtained and is 0.98. Cardiology will be consulted.  Eventhought rhythm is regular, cardiology feels EKG consistent with Afib.  Patient spontaneously.  Medical screening examination/treatment/procedure(s) were conducted as a shared visit with non-physician practitioner(s) and myself.  I personally evaluated the patient during the encounter  Shon Baton, MD 01/22/13 [phone]

## 2013-01-21 NOTE — H&P (Signed)
Johnathan [name] is an 79 y.o. male.    Primary Cardiologist::  Dr. Earney Hamburg, MD  Chief Complaint: chest pain, weak, tired HPI: 79 year old white male with increasing memory problems are slowly worsening. He had not had any recent issues with chest pain or dyspnea either at rest or with exertion. He wears oxygen 3 L continuously. He has had a few episodes of atrial fibrillation detected by his pacemaker in June but has been unaware of them. He is no longer on full anticoagulation therapy due to problems with hemoptysis, frailty and easy falling. He has not had any new focal neurological deficits to suggest a cardioembolic event. He does not have lower showed edema or symptoms of shortness of breath as long as he wears his oxygen.  Cardiac hx includes s/p stent to prox. LAD.  Last cath :Cardiac cath 08/10/11 IMPRESSION:  1. Mild left ventricular dysfunction with severe hypo to akinesis of  the distal anterolateral wall extending around the apex with focal  apical dyskinesis.  2. Significant coronary artery disease with coronary calcification  with evidence for 30% Johnathan proximal LAD stenosis prior to the  stented segment with a widely patent proximal LAD stent, diffuse  disease in a small-caliber but long second diagonal vessel with 90%  diffuse proximal stenosis and 70- 90% mid stenosis, 70% stenosis,  and calcified eccentric mid LAD segment, not significantly improved  following IC nitroglycerin administration; 30% optional diagonal  stenosis.  Normal left circumflex coronary artery.  Dominant right coronary artery: Dominant large right coronary artery  with 20% mid stenosis and 50% posterolateral stenosis.--Medical therapy was recommended.   Today had been in his usual state of health until he began complaining of chest pain -tightness across his chest.  No SOB, no nausea, no lightheadedness, no syncope.  His activity level decreased, he seemed Johnathan tired.  His sister  brought him to the hospital and he was found to be in Atrial fib with RVR with rate 132.   He has now converted to SR with Pacing.  He has dementia and does not remember his chest pain at times.  Currently he is pain free.  His troponin POC is elevated to 0.98.  This  Initial EKG with A fib RVR with deep t wave inversions in V5-6.  These improved with SR.  Similar presentation on admit in 07/2011 when he had the cardiac cath.     Past Medical History  Diagnosis Date  . Myocardial infarct 2003    LAD stent  . Coronary artery disease 2006    re do LAD stent  . Bradycardia 2006    St Jude pacemaker  . BPH (benign prostatic hyperplasia)   . Dysrhythmia   . Pacemaker   . Shortness of breath   . Arthritis   . Dementia     Past Surgical History  Procedure Laterality Date  . Pacemaker insertion    . Appendectomy    . Cardiac catheterization    . Insert / replace / remove pacemaker    . Tee without cardioversion  08/15/2011    Procedure: TRANSESOPHAGEAL ECHOCARDIOGRAM (TEE);  Surgeon: [name], MD;  Location: [facility] ENDOSCOPY;  Service: Cardiovascular;  Laterality: N/A;  . Cardioversion  08/15/2011    Procedure: CARDIOVERSION;  Surgeon: [name], MD;  Location: [facility] ENDOSCOPY;  Service: Cardiovascular;  Laterality: N/A;   FAMILY HISTORY:  Both parents are dead.  Pt has dementia and is not able to  give family history.  He himself has known CAD.   History reviewed. No pertinent family history.   Social History:  reports that he quit smoking about 39 years ago. He does not have any smokeless tobacco history on file. He reports that he drinks about 8.4 ounces of alcohol per week. He reports that he does not use illicit drugs.  Allergies:  Allergies  Allergen Reactions  . Aspirin     Has heart problems    Out Patient Medications:  Current Outpatient Prescriptions   Medication  Sig  Dispense  Refill   .  amiodarone (PACERONE) 200 MG tablet  Take 100 mg by mouth daily.     Marland Kitchen   aspirin 81 MG chewable tablet  Chew 162 mg by mouth daily.     Marland Kitchen  donepezil (ARICEPT) 10 MG tablet  Take 10 mg by mouth every morning.     .  furosemide (LASIX) 20 MG tablet  Take 40 mg by mouth daily.     Marland Kitchen  levothyroxine (SYNTHROID, LEVOTHROID) 75 MCG tablet  Take 75 mcg by mouth daily.     .  potassium chloride (K-DUR,KLOR-CON) 10 MEQ tablet  Take 1 tablet (10 mEq total) by mouth daily.  30 tablet  5   .  isosorbide mononitrate (IMDUR) 15 mg TB24  Take 30 mg by mouth daily.     .  metoprolol (LOPRESSOR) 100 MG tablet  Take 50 mg by mouth 2 (two) times daily.        Results for orders placed during the hospital encounter of 01/21/13 (from the past 48 hour(s))  CBC WITH DIFFERENTIAL     Status: Abnormal   Collection Time    01/21/13  3:53 PM      Result Value Range   WBC 4.8  4.0 - 10.5 K/uL   RBC 3.66 (*) 4.22 - 5.81 MIL/uL   Hemoglobin 12.6 (*) 13.0 - 17.0 g/dL   HCT 14.7 (*) 82.9 - 56.2 %   MCV 100.5 (*) 78.0 - 100.0 fL   MCH 34.4 (*) 26.0 - 34.0 pg   MCHC 34.2  30.0 - 36.0 g/dL   RDW 13.0  86.5 - 78.4 %   Platelets 144 (*) 150 - 400 K/uL   Neutrophils Relative % 66  43 - 77 %   Neutro Abs 3.1  1.7 - 7.7 K/uL   Lymphocytes Relative 17  12 - 46 %   Lymphs Abs 0.8  0.7 - 4.0 K/uL   Monocytes Relative 15 (*) 3 - 12 %   Monocytes Absolute 0.7  0.1 - 1.0 K/uL   Eosinophils Relative 2  0 - 5 %   Eosinophils Absolute 0.1  0.0 - 0.7 K/uL   Basophils Relative 0  0 - 1 %   Basophils Absolute 0.0  0.0 - 0.1 K/uL  BASIC METABOLIC PANEL     Status: Abnormal   Collection Time    01/21/13  3:53 PM      Result Value Range   Sodium 135  135 - 145 mEq/L   Potassium 4.1  3.5 - 5.1 mEq/L   Chloride 97  96 - 112 mEq/L   CO2 30  19 - 32 mEq/L   Glucose, Bld 89  70 - 99 mg/dL   BUN 19  6 - 23 mg/dL   Creatinine, Ser 6.96  0.50 - 1.35 mg/dL   Calcium 8.5  8.4 - 29.5 mg/dL   GFR calc non Af Amer 70 (*) >90  mL/min   GFR calc Af Amer 81 (*) >90 mL/min   Comment: (NOTE)     The eGFR has been  calculated using the CKD EPI equation.     This calculation has not been validated in all clinical situations.     eGFR's persistently <90 mL/min signify possible Chronic Kidney     Disease.  POCT I-STAT TROPONIN I     Status: Abnormal   Collection Time    01/21/13  4:06 PM      Result Value Range   Troponin i, poc 0.98 (*) 0.00 - 0.08 ng/mL   Comment NOTIFIED PHYSICIAN     Comment 3            Comment: Due to the release kinetics of cTnI,     a negative result within the first hours     of the onset of symptoms does not rule out     myocardial infarction with certainty.     If myocardial infarction is still suspected,     repeat the test at appropriate intervals.   Dg Chest Portable 1 View  01/21/2013   CLINICAL DATA:  Chest tightness, cough  EXAM: PORTABLE CHEST - 1 VIEW  COMPARISON:  09/12/2011  FINDINGS: Right dual lead pacer remains in place, unchanged. Cardiomegaly. Hyperinflation of the lungs. No confluent opacities or effusions. No acute bony abnormality.  IMPRESSION: Cardiomegaly, COPD. No acute findings.   Electronically Signed   By: Charlett Nose   On: 01/21/2013 16:05    ROS: General:no colds or fevers, but chronic cough, coughs up white mucus , coughs Johnathan hard at times to rid himself of mucus ( not on ACE)  no weight changes Skin:no rashes or ulcers HEENT:no blurred vision, no congestion CV:see HPI PUL:see HPI GI:no diarrhea constipation or melena, no indigestion GU:no hematuria, no dysuria MS:no joint pain, no claudication Neuro:no syncope, no lightheadedness Endo:no diabetes, + thyroid disease   Blood pressure 114/47, pulse 48, temperature 98.1 F (36.7 C), temperature source Oral, resp. rate 23, SpO2 97.00%. PE: General:Pleasant affect, NAD,  Skin:Warm and dry, brisk capillary refill HEENT:normocephalic, sclera clear, mucus membranes moist Neck:supple, no JVD, no bruits  Heart:S1S2 RRR without murmur, gallup, rub or click Lungs:clear without rales, rhonchi, or  wheezes WGN:FAOZ, non tender, + BS, do not palpate liver spleen or masses Ext:no lower ext edema, 1+ post tib pulses, 2+ radial pulses Neuro:alert and oriented currently, MAE, follows commands, + facial symmetry   Assessment/Plan Principal Problem:   Chest pain at rest, with PAF with RVR Active Problems:   Cardiomyopathy, ischemic   Pacemaker, St Jude 2006   Cough   PAF (paroxysmal atrial fibrillation)   Troponin level elevated  PLAN: Keep overnight and check cardiac enzymes, monitor for tachycardia..  If stable discharge in AM.  Will check hepatic panel and TSH on Amiodarone.  For cough will try tessalon Perles. He does use nebulizer at home.    [facility] R Nurse Practitioner Certified [facility] Pager [phone] 01/21/2013, 6:25 PM  I have seen and evaluated the patient this PM along with Nada Boozer, NP. I agree with her findings, examination as well as impression recommendations.   Johnathan [name] who is a patient of Dr. Erin Hearing with known atrial fibrillation, status post pacemaker placement for tachybradycardia syndrome, mostly been controlled with amiodarone he was not on anticoagulation due to problems with hemoptysis.  He has been having significantly worse coughing proximal is and is over last couple weeks.  He presented today after his sister called their primary care physician in due to the patient complaining of chest pain and feeling tired and weak.  He was told to come to the emergency room was found to be in atrial fibrillation with rapid ventricular rate.  This broke spontaneously on arrival here in an atrial sensed ventricular paced rhythm [facility] to paced rhythm, has had no further atrial fibrillation.  His symptoms subsided once he will broke from rapid atrial fibrillation.  He is now resting comfortably.  He still has a moderate cough.  He had mild elevation in troponins.  He had a similar presentation last year and  underwent cardiac catheterization which found significant disease in small diagonal branches that were thought to be best treated medically.  He clearly has a reason to have anginal pain with even mild elevation in troponin for atrial fibrillation with RVR based on his known coronary anatomy.  Overall besides one episode, and his coughing, he says he's been feeling "great".  Apparently at his last pacemaker check, he has had some intermittent bouts of short atrial fibrillation but not rapid rates.  He was asymptomatic with them -- proving healing became symptomatic when he is in rapid rate.  Exam is relatively benign.  I agree to admit the patient to observation status to ensure that further significant increase in troponin levels.  As he is not on long-term evaluation, and is positive markers were probably due to rapid rate, I do not feel strongly that we need to anticoagulate with IV heparin.  The subcutaneous separate spine.  We will double his amiodarone dosing of the next couple days.  Will provide cough suppressants.  I think may be his episode could have been triggered by a proximal is not coughing.  This is a chronic cough congestion that he has.  Apparently a cold a month or so ago and the cough has gotten worse since then.  Chest x-ray is negative for any evidence of pneumonia.  Anticipate off several nights with plan for discharge in the morning if no further episodes and his troponin do not continue to climb.  MD Time with pt: 35 min - with NP time ~1 hr  HARDING,DAVID W, M.D., M.S. [facility] [address] [place], [place]  [address]  [phone] Pager # [phone] 01/21/2013 6:43 PM

## 2013-01-21 NOTE — ED Notes (Signed)
Heather PA at bedside. Pt alert, oriented to situation. In NAD denies SOB sts minimal chest pressure. "not much".

## 2013-01-21 NOTE — Progress Notes (Signed)
CRITICAL VALUE ALERT  Critical value received: Troponin 4.8  Date of notification:  01/21/2013  Time of notification:  2320  Critical value read back:yes  Nurse who received alert:  Dorna Leitz RN  MD notified (1st page):  Dr. Tresa Endo   Time of first page:  2330  MD notified (2nd page):  Time of second page:  Responding MD:  Dr Tresa Endo  Time MD responded:  2333

## 2013-01-21 NOTE — Progress Notes (Signed)
Brief X-Cover Note  Troponin now elevated at 4. POC troponin elevated at 1. He has known atrial fibrillation and some underlying CAD with last LHC 2013. He also is currently CP free but he has some dementia so this can be challenging to tell. He has no change in symptoms. Vitals stable, BP 134/67  Pulse 76  Temp(Src) 97.4 F (36.3 C) (Oral)  Resp 18  Ht 5\' 9"  (1.753 m)  Wt 62.052 kg (136 lb 12.8 oz)  BMI 20.19 kg/m2  SpO2 93%Will plan to continue to watch closely, follow-up next troponin before considering IV heparin which has already been deferred and patient remains asymptomatic and hemodynamically stable.   Leeann Must, MD

## 2013-01-21 NOTE — ED Provider Notes (Signed)
CSN: [id number]     Arrival date & time 01/21/13  1518 History   First MD Initiated Contact with Patient 01/21/13 1532     Chief Complaint  Patient presents with  . 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 Jude[employer] Pacemaker presents with a chief complaint of chest pressure.  He reports that the pressure is located across his chest and does not radiate.  Patient does also have a history of dementia and is unable to give any further details about the chest pain.  He thinks that it has been present since noon, but is unsure.  He thinks it has been constant.  He was given one SL NG en route to the ED by EMS, which he does not feel helped.  He denies SOB, fever, chills, nausea, vomiting, or lower extremity swelling.  He states that he has had a productive cough for the past 2 weeks.  He is followed by [facility].    The history is provided by the patient.    Past Medical History  Diagnosis Date  . Myocardial infarct 2003    LAD stent  . Coronary artery disease 2006    re do LAD stent  . Bradycardia 2006    St Jude pacemaker  . BPH (benign prostatic hyperplasia)   . Dysrhythmia   . Pacemaker   . Shortness of breath   . Arthritis   . Dementia    Past Surgical History  Procedure Laterality Date  . Pacemaker insertion    . Appendectomy    . Cardiac catheterization    . Insert / replace / remove pacemaker    . Tee without cardioversion  08/15/2011    Procedure: TRANSESOPHAGEAL ECHOCARDIOGRAM (TEE);  Surgeon: [name], MD;  Location: [facility] ENDOSCOPY;  Service: Cardiovascular;  Laterality: N/A;  . Cardioversion  08/15/2011    Procedure: CARDIOVERSION;  Surgeon: [name], MD;  Location: [facility] ENDOSCOPY;  Service: Cardiovascular;  Laterality: N/A;   No family history on file. History   Substance Use Topics  . Smoking status: Former Smoker    Quit date: 08/09/1973  . Smokeless tobacco: Not on file  . Alcohol Use: 8.4 oz/week    14 Glasses of wine per week    Review of Systems  Constitutional: Negative for fever and chills.  Respiratory: Negative for shortness of breath.   Cardiovascular: Positive for chest pain.  Gastrointestinal: Negative for nausea and vomiting.  All other systems reviewed and are negative.    Allergies  Aspirin  Home Medications   Current Outpatient Rx  Name  Route  Sig  Dispense  Refill  . amiodarone (PACERONE) 200 MG tablet   Oral   Take 0.5 tablets (100 mg total) by mouth daily.   30 tablet   6   . aspirin 81 MG chewable tablet   Oral   Chew 162 mg by mouth daily.          Marland Kitchen donepezil (ARICEPT) 10 MG tablet   Oral   Take 10 mg by mouth every morning.          . furosemide (LASIX) 20 MG tablet   Oral   Take 40 mg by mouth daily.          . isosorbide mononitrate (IMDUR) 15 mg TB24   Oral   Take 30 mg by mouth daily.         Marland Kitchen  levothyroxine (SYNTHROID, LEVOTHROID) 75 MCG tablet   Oral   Take 75 mcg by mouth daily.         . metoprolol (LOPRESSOR) 100 MG tablet   Oral   Take 50 mg by mouth 2 (two) times daily.         Marland Kitchen EXPIRED: potassium chloride (K-DUR,KLOR-CON) 10 MEQ tablet   Oral   Take 1 tablet (10 mEq total) by mouth daily.   30 tablet   5    BP 119/99  Pulse 132  Temp(Src) 98.1 F (36.7 C) (Oral)  Resp 21  SpO2 94% Physical Exam  Nursing note and vitals reviewed. Constitutional: He appears well-developed and well-nourished. No distress.  HENT:  Head: Normocephalic and atraumatic.  Mouth/Throat: Oropharynx is clear and moist.  Neck: Normal range of motion. Neck supple.  Cardiovascular: Regular rhythm and normal heart sounds.  Tachycardia present.   Pulmonary/Chest: Effort normal and breath sounds normal. No respiratory distress. He has no wheezes. He has no rales.  Abdominal: Soft.  Bowel sounds are normal. He exhibits no distension and no mass. There is no tenderness. There is no rebound and no guarding.  Musculoskeletal:  No LE edema  Neurological: He is alert.  Skin: Skin is warm and dry. He is not diaphoretic.  Psychiatric: He has a normal mood and affect.    ED Course  Procedures (including critical care time) Labs Review Labs Reviewed  CBC WITH DIFFERENTIAL  BASIC METABOLIC PANEL   Imaging Review No results found.   Date: 01/23/2013  Rate: 132  Rhythm: sinus tachycardia  QRS Axis: normal  Intervals: normal  ST/T Wave abnormalities: nonspecific ST changes  Conduction Disutrbances:none  Narrative Interpretation:   Old EKG Reviewed: changes noted, new ST depression in leads V4 and V5   4:30 PM Discussed patient with [facility].  She reports that they will be down to see the patient in the ED. 5:00 PM Patient now has a heart rate of 63 bpm.  Heart rate decreased without intervention.  MDM  No diagnosis found. Patient with a history of CAD, Atrial Fibrillation, who also has a pacemaker presents today with a chief complaint of chest pressure.  Patient with dementia and unable to give many details of the chest pressure.  EKG showing new ST depression in V4 and V5 along with sinus tachycardia.  Troponin found to be elevated.  CXR negative.  Cardiology consulted and admitted patient for further monitoring and management.      Ryheem Jay Armstrong, PA-C 01/23/13 [phone]

## 2013-01-22 ENCOUNTER — Encounter (HOSPITAL_COMMUNITY): Payer: Self-pay | Admitting: General Practice

## 2013-01-22 DIAGNOSIS — J449 Chronic obstructive pulmonary disease, unspecified: Secondary | ICD-10-CM | POA: Diagnosis present

## 2013-01-22 DIAGNOSIS — I214 Non-ST elevation (NSTEMI) myocardial infarction: Secondary | ICD-10-CM | POA: Diagnosis present

## 2013-01-22 DIAGNOSIS — R042 Hemoptysis: Secondary | ICD-10-CM | POA: Diagnosis present

## 2013-01-22 LAB — CBC
Hemoglobin: 11.1 g/dL — ABNORMAL LOW (ref 13.0–17.0)
MCH: 33.6 pg (ref 26.0–34.0)
MCV: 100.6 fL — ABNORMAL HIGH (ref 78.0–100.0)
RBC: 3.3 MIL/uL — ABNORMAL LOW (ref 4.22–5.81)

## 2013-01-22 LAB — BASIC METABOLIC PANEL
CO2: 25 mEq/L (ref 19–32)
Calcium: 8.1 mg/dL — ABNORMAL LOW (ref 8.4–10.5)
Chloride: 104 mEq/L (ref 96–112)
Creatinine, Ser: 0.83 mg/dL (ref 0.50–1.35)
Glucose, Bld: 91 mg/dL (ref 70–99)
Sodium: 139 mEq/L (ref 135–145)

## 2013-01-22 LAB — LIPID PANEL
HDL: 51 mg/dL (ref >39)
LDL Cholesterol: 58 mg/dL (ref 0–99)
Total CHOL/HDL Ratio: 2.4 RATIO
VLDL: 12 mg/dL (ref 0–40)

## 2013-01-22 LAB — TSH: TSH: 1.576 u[IU]/mL (ref 0.350–4.500)

## 2013-01-22 LAB — TROPONIN I: Troponin I: 3.45 ng/mL (ref <0.30)

## 2013-01-22 MED ORDER — ISOSORBIDE MONONITRATE ER 60 MG PO TB24
60.0000 mg | ORAL_TABLET | Freq: Every day | ORAL | Status: DC
  Administered 2013-01-22 – 2013-01-23 (×2): 60 mg via ORAL
  Filled 2013-01-22 (×2): qty 1

## 2013-01-22 MED ORDER — GUAIFENESIN ER 600 MG PO TB12
600.0000 mg | ORAL_TABLET | Freq: Two times a day (BID) | ORAL | Status: DC
  Administered 2013-01-22 – 2013-01-23 (×3): 600 mg via ORAL
  Filled 2013-01-22 (×4): qty 1

## 2013-01-22 MED ORDER — HEART ATTACK BOUNCING BOOK
Freq: Once | Status: AC
  Administered 2013-01-23: 13:00:00
  Filled 2013-01-22: qty 1

## 2013-01-22 MED ORDER — OFF THE BEAT BOOK
Freq: Once | Status: AC
  Administered 2013-01-23: 13:00:00
  Filled 2013-01-22: qty 1

## 2013-01-22 NOTE — Progress Notes (Signed)
Utilization review completed.  

## 2013-01-22 NOTE — Progress Notes (Addendum)
Pt. Seen and examined. Agree with the NP/PA-C note as written. Troponin increased to 4.28, but now trending down with medical therapy. Not a cath candidate. Patient with dementia and son in the room, both report not knowing he had a "heart attack". Continue medical therapy. I think it is too early to discharge since he presented only yesterday. Monitor today for tolerance of medication changes. Add mucinex per his request. Probably d/c early tomorrow - family was supposed to go on a trip starting yesterday.  [name], MD, [facility] Attending Cardiologist [facility]

## 2013-01-22 NOTE — Progress Notes (Signed)
PA on call made aware of pts second Troponin 3.79. Pt denies CP. No new orders, will continue to monitor.

## 2013-01-22 NOTE — Progress Notes (Signed)
Subjective:  No chest pain this am. He can't remember his symptoms from yesterday.  Objective:  Vital Signs in the last 24 hours: Temp:  [97.4 F (36.3 C)-98.1 F (36.7 C)] 97.9 F (36.6 C) (08/28 0401) Pulse Rate:  [22-137] 60 (08/28 0401) Resp:  [14-26] 18 (08/28 0401) BP: (103-138)/(47-99) 103/56 mmHg (08/28 0401) SpO2:  [93 %-99 %] 95 % (08/28 0401) Weight:  [136 lb 12.8 oz (62.052 kg)] 136 lb 12.8 oz (62.052 kg) (08/28 0401)  Intake/Output from previous day:  Intake/Output Summary (Last 24 hours) at 01/22/13 0935 Last data filed at 01/21/13 2206  Gross per 24 hour  Intake      3 ml  Output      0 ml  Net      3 ml    Physical Exam: General appearance: alert, cooperative, appears stated age and no distress Lungs: clear to auscultation bilaterally Heart: regular rate and rhythm   Rate: 70  Rhythm: paced  Lab Results:  Recent Labs  01/21/13 2210 01/22/13 0400  WBC 4.6 3.4*  HGB 11.9* 11.1*  PLT 136* 130*    Recent Labs  01/21/13 1553 01/21/13 2210 01/22/13 0400  NA 135  --  139  K 4.1  --  3.8  CL 97  --  104  CO2 30  --  25  GLUCOSE 89  --  91  BUN 19  --  17  CREATININE 0.92 0.96 0.83    Recent Labs  01/21/13 2210 01/22/13 0400  TROPONINI 4.80* 3.79*    Recent Labs  01/21/13 2210  INR 1.04    Imaging: Imaging results have been reviewed  Cardiac Studies:  Assessment/Plan:   Principal Problem:   NSTEMI (non-ST elevated myocardial infarction) Active Problems:   Chest pain at rest, with PAF with RVR   CAD, LAD stent 2003, 2006, patent with distal disease by cath 08/13/11   HTN (hypertension)   Pacemaker, St Jude 2006   Cardiomyopathy, ischemic   History of cough with hemoptysis- he is not on chronic anticoagulation   COPD (chronic obstructive pulmonary disease)   Cough   PAF (paroxysmal atrial fibrillation)    PLAN:  He is on ASA, SQ Heparin, beta blocker, and Imdur 30 mg. He has not been fully anticoagulated because of a  history of hemoptysis. He is a full code but I doubt he is a candidate for any aggressive work up at 79 y/o with dementia. Will empirically increase his Imdur to 60 mg.  ? Keep another 24 hrs post NSTEMI.  Corine Shelter PA-C Beeper [phone] 01/22/2013, 9:35 AM

## 2013-01-23 MED ORDER — NITROGLYCERIN 0.4 MG SL SUBL: 0.4000 mg | SUBLINGUAL_TABLET | SUBLINGUAL | Status: AC | PRN

## 2013-01-23 MED ORDER — GUAIFENESIN ER 600 MG PO TB12: 600.0000 mg | ORAL_TABLET | Freq: Two times a day (BID) | ORAL | Status: DC

## 2013-01-23 MED ORDER — FUROSEMIDE 10 MG/ML IJ SOLN
40.0000 mg | Freq: Once | INTRAMUSCULAR | Status: AC
  Administered 2013-01-23: 40 mg via INTRAVENOUS
  Filled 2013-01-23: qty 4

## 2013-01-23 MED ORDER — ACETAMINOPHEN 325 MG PO TABS: 650.0000 mg | ORAL_TABLET | ORAL | Status: AC | PRN

## 2013-01-23 MED ORDER — OMEPRAZOLE 20 MG PO CPDR
20.0000 mg | DELAYED_RELEASE_CAPSULE | Freq: Every day | ORAL | Status: DC
  Filled 2013-01-23: qty 1

## 2013-01-23 MED ORDER — AMIODARONE HCL 200 MG PO TABS: 200.0000 mg | ORAL_TABLET | Freq: Two times a day (BID) | ORAL | Status: DC

## 2013-01-23 MED ORDER — OMEPRAZOLE 20 MG PO CPDR: 20.0000 mg | DELAYED_RELEASE_CAPSULE | Freq: Every day | ORAL | Status: DC

## 2013-01-23 MED ORDER — ISOSORBIDE MONONITRATE 15 MG HALF TABLET: 30.0000 mg | ORAL_TABLET | Freq: Every day | ORAL | Status: DC

## 2013-01-23 MED ORDER — METOPROLOL TARTRATE 50 MG PO TABS: 50.0000 mg | ORAL_TABLET | Freq: Two times a day (BID) | ORAL | Status: DC

## 2013-01-23 MED ORDER — FUROSEMIDE 40 MG PO TABS: 40.0000 mg | ORAL_TABLET | Freq: Every day | ORAL | Status: DC

## 2013-01-23 MED ORDER — BENZONATATE 100 MG PO CAPS: 100.0000 mg | ORAL_CAPSULE | Freq: Three times a day (TID) | ORAL | Status: DC | PRN

## 2013-01-23 NOTE — Discharge Summary (Signed)
Patient ID: Johnathan [name],  MRN: [id number], DOB/AGE: Mar 24, 1916 79 y.o.  Admit date: 01/21/2013 Discharge date: 01/23/2013  Primary Care Provider:  Primary Cardiologist: Dr Royann Shivers  Discharge Diagnoses:  Principal Problem:   NSTEMI (non-ST elevated myocardial infarction) Active Problems:   Unstable angina in the setting of rapid AF, NSTEMI   Cough   Dementia   PAF (paroxysmal atrial fibrillation)   CAD, LAD stent 2003, 2006, patent with distal disease by cath 08/13/11   HTN (hypertension)   Pacemaker, St Jude 2006   BPH (benign prostatic hyperplasia)   Cardiomyopathy, ischemic   History of cough with hemoptysis- he is not on chronic anticoagulation   COPD (chronic obstructive pulmonary disease)     Hospital Course The patient is a 79 year old gentleman with a longstanding history of coronary disease, previous coronary stenting, and mild to moderately depressed left ventricular systolic function, with congestive heart failure in the setting of atrial fibrillation. He has actually responded quite well to treatment with amiodarone, and this has led to considerable improvement in his overall wellbeing. He does have baseline dementia with poor memory. He is poorly tolerant of atrial fibrillation. He is at high risk for fall and bleeding and has had hemoptysis in the past. He has a chronic cough and has been seen by pulmonary for this. He is not on anticoagulant therapy but takes aspirin for stroke prophylaxis.  He has a [employer] technical [employer] pacemaker that was implanted in 2006 for 2nd-degree heart block. The device is programmed DDI, both because of previous atrial tachyarrhythmias. He was admitted through the ER 01/21/13 with unstable angina in the setting of rapid atrial fibrillation. He ruled in for a NSTEMI with a Troponin of 4. His symptoms improved with rate control. He does have a baseline low B/P that limits his medical Rx. His Amiodarone was increased to 200 mg BID. Dr  Royann Shivers would like to continue this till he sees him as an OP. There has been some concern his chronic cough may be from "Amio lung" but unfortunately therapeutic options are limited. He has had elevated BNP levels in the past but on exam he had no overt CHF. He is on a daily diuretic.   Discharge Vitals:  Blood pressure 102/66, pulse 60, temperature 98.2 F (36.8 C), temperature source Oral, resp. rate 18, height 5\' 9"  (1.753 m), weight 135 lb 3.2 oz (61.326 kg), SpO2 95.00%.    Labs: Results for orders placed during the hospital encounter of 01/21/13 (from the past 48 hour(s))  CBC WITH DIFFERENTIAL     Status: Abnormal   Collection Time    01/21/13  3:53 PM      Result Value Range   WBC 4.8  4.0 - 10.5 K/uL   RBC 3.66 (*) 4.22 - 5.81 MIL/uL   Hemoglobin 12.6 (*) 13.0 - 17.0 g/dL   HCT 40.9 (*) 81.1 - 91.4 %   MCV 100.5 (*) 78.0 - 100.0 fL   MCH 34.4 (*) 26.0 - 34.0 pg   MCHC 34.2  30.0 - 36.0 g/dL   RDW 78.2  95.6 - 21.3 %   Platelets 144 (*) 150 - 400 K/uL   Neutrophils Relative % 66  43 - 77 %   Neutro Abs 3.1  1.7 - 7.7 K/uL   Lymphocytes Relative 17  12 - 46 %   Lymphs Abs 0.8  0.7 - 4.0 K/uL   Monocytes Relative 15 (*) 3 - 12 %   Monocytes Absolute 0.7  0.1 -  1.0 K/uL   Eosinophils Relative 2  0 - 5 %   Eosinophils Absolute 0.1  0.0 - 0.7 K/uL   Basophils Relative 0  0 - 1 %   Basophils Absolute 0.0  0.0 - 0.1 K/uL  BASIC METABOLIC PANEL     Status: Abnormal   Collection Time    01/21/13  3:53 PM      Result Value Range   Sodium 135  135 - 145 mEq/L   Potassium 4.1  3.5 - 5.1 mEq/L   Chloride 97  96 - 112 mEq/L   CO2 30  19 - 32 mEq/L   Glucose, Bld 89  70 - 99 mg/dL   BUN 19  6 - 23 mg/dL   Creatinine, Ser 1.61  0.50 - 1.35 mg/dL   Calcium 8.5  8.4 - 09.6 mg/dL   GFR calc non Af Amer 70 (*) >90 mL/min   GFR calc Af Amer 81 (*) >90 mL/min   Comment: (NOTE)     The eGFR has been calculated using the CKD EPI equation.     This calculation has not been validated  in all clinical situations.     eGFR's persistently <90 mL/min signify possible Chronic Kidney     Disease.  POCT I-STAT TROPONIN I     Status: Abnormal   Collection Time    01/21/13  4:06 PM      Result Value Range   Troponin i, poc 0.98 (*) 0.00 - 0.08 ng/mL   Comment NOTIFIED PHYSICIAN     Comment 3            Comment: Due to the release kinetics of cTnI,     a negative result within the first hours     of the onset of symptoms does not rule out     myocardial infarction with certainty.     If myocardial infarction is still suspected,     repeat the test at appropriate intervals.  TROPONIN I     Status: Abnormal   Collection Time    01/21/13 10:10 PM      Result Value Range   Troponin I 4.80 (*) <0.30 ng/mL   Comment:            Due to the release kinetics of cTnI,     a negative result within the first hours     of the onset of symptoms does not rule out     myocardial infarction with certainty.     If myocardial infarction is still suspected,     repeat the test at appropriate intervals.     CRITICAL RESULT CALLED TO, READ BACK BY AND VERIFIED WITH:     GUFFEY,A RN 01/21/2013 2322 JORDANS     REPEATED TO VERIFY  APTT     Status: None   Collection Time    01/21/13 10:10 PM      Result Value Range   aPTT 36  24 - 37 seconds  PROTIME-INR     Status: None   Collection Time    01/21/13 10:10 PM      Result Value Range   Prothrombin Time 13.4  11.6 - 15.2 seconds   INR 1.04  0.00 - 1.49  TSH     Status: None   Collection Time    01/21/13 10:10 PM      Result Value Range   TSH 1.576  0.350 - 4.500 uIU/mL   Comment: Performed at [facility]  T4, FREE     Status: None   Collection Time    01/21/13 10:10 PM      Result Value Range   Free T4 1.25  0.80 - 1.80 ng/dL   Comment: Performed at [facility]  MAGNESIUM     Status: None   Collection Time    01/21/13 10:10 PM      Result Value Range   Magnesium 2.2  1.5 - 2.5 mg/dL  PRO B NATRIURETIC  PEPTIDE     Status: Abnormal   Collection Time    01/21/13 10:10 PM      Result Value Range   Pro B Natriuretic peptide (BNP) 4337.0 (*) 0 - 450 pg/mL  HEMOGLOBIN A1C     Status: None   Collection Time    01/21/13 10:10 PM      Result Value Range   Hemoglobin A1C 5.1  <5.7 %   Comment: (NOTE)                                                                               According to the ADA Clinical Practice Recommendations for 2011, when     HbA1c is used as a screening test:      >=6.5%   Diagnostic of Diabetes Mellitus               (if abnormal result is confirmed)     5.7-6.4%   Increased risk of developing Diabetes Mellitus     References:Diagnosis and Classification of Diabetes Mellitus,Diabetes     Care,2011,34(Suppl 1):S62-S69 and Standards of Medical Care in             Diabetes - 2011,Diabetes Care,2011,34 (Suppl 1):S11-S61.   Mean Plasma Glucose 100  <117 mg/dL   Comment: Performed at [facility]  CBC     Status: Abnormal   Collection Time    01/21/13 10:10 PM      Result Value Range   WBC 4.6  4.0 - 10.5 K/uL   RBC 3.41 (*) 4.22 - 5.81 MIL/uL   Hemoglobin 11.9 (*) 13.0 - 17.0 g/dL   HCT 16.1 (*) 09.6 - 04.5 %   MCV 100.9 (*) 78.0 - 100.0 fL   MCH 34.9 (*) 26.0 - 34.0 pg   MCHC 34.6  30.0 - 36.0 g/dL   RDW 40.9  81.1 - 91.4 %   Platelets 136 (*) 150 - 400 K/uL  CREATININE, SERUM     Status: Abnormal   Collection Time    01/21/13 10:10 PM      Result Value Range   Creatinine, Ser 0.96  0.50 - 1.35 mg/dL   GFR calc non Af Amer 69 (*) >90 mL/min   GFR calc Af Amer 80 (*) >90 mL/min   Comment: (NOTE)     The eGFR has been calculated using the CKD EPI equation.     This calculation has not been validated in all clinical situations.     eGFR's persistently <90 mL/min signify possible Chronic Kidney     Disease.  HEPATIC FUNCTION PANEL     Status: Abnormal   Collection Time    01/21/13 10:10 PM  Result Value Range   Total Protein 5.7 (*) 6.0 - 8.3  g/dL   Albumin 3.5  3.5 - 5.2 g/dL   AST 42 (*) 0 - 37 U/L   ALT 15  0 - 53 U/L   Alkaline Phosphatase 58  39 - 117 U/L   Total Bilirubin 0.5  0.3 - 1.2 mg/dL   Bilirubin, Direct 0.2  0.0 - 0.3 mg/dL   Indirect Bilirubin 0.3  0.3 - 0.9 mg/dL  TROPONIN I     Status: Abnormal   Collection Time    01/22/13  4:00 AM      Result Value Range   Troponin I 3.79 (*) <0.30 ng/mL   Comment:            Due to the release kinetics of cTnI,     a negative result within the first hours     of the onset of symptoms does not rule out     myocardial infarction with certainty.     If myocardial infarction is still suspected,     repeat the test at appropriate intervals.     CRITICAL VALUE NOTED.  VALUE IS CONSISTENT WITH PREVIOUSLY REPORTED AND CALLED VALUE.     REPEATED TO VERIFY  CBC     Status: Abnormal   Collection Time    01/22/13  4:00 AM      Result Value Range   WBC 3.4 (*) 4.0 - 10.5 K/uL   RBC 3.30 (*) 4.22 - 5.81 MIL/uL   Hemoglobin 11.1 (*) 13.0 - 17.0 g/dL   HCT 78.2 (*) 95.6 - 21.3 %   MCV 100.6 (*) 78.0 - 100.0 fL   MCH 33.6  26.0 - 34.0 pg   MCHC 33.4  30.0 - 36.0 g/dL   RDW 08.6  57.8 - 46.9 %   Platelets 130 (*) 150 - 400 K/uL  BASIC METABOLIC PANEL     Status: Abnormal   Collection Time    01/22/13  4:00 AM      Result Value Range   Sodium 139  135 - 145 mEq/L   Potassium 3.8  3.5 - 5.1 mEq/L   Chloride 104  96 - 112 mEq/L   CO2 25  19 - 32 mEq/L   Glucose, Bld 91  70 - 99 mg/dL   BUN 17  6 - 23 mg/dL   Creatinine, Ser 6.29  0.50 - 1.35 mg/dL   Calcium 8.1 (*) 8.4 - 10.5 mg/dL   GFR calc non Af Amer 73 (*) >90 mL/min   GFR calc Af Amer 85 (*) >90 mL/min   Comment: (NOTE)     The eGFR has been calculated using the CKD EPI equation.     This calculation has not been validated in all clinical situations.     eGFR's persistently <90 mL/min signify possible Chronic Kidney     Disease.  LIPID PANEL     Status: None   Collection Time    01/22/13  4:00 AM      Result  Value Range   Cholesterol 121  0 - 200 mg/dL   Triglycerides 59  <528 mg/dL   HDL 51  >41 mg/dL   Total CHOL/HDL Ratio 2.4     VLDL 12  0 - 40 mg/dL   LDL Cholesterol 58  0 - 99 mg/dL   Comment:            Total Cholesterol/HDL:CHD Risk     Coronary Heart Disease Risk Table  Men   Women      1/2 Average Risk   3.4   3.3      Average Risk       5.0   4.4      2 X Average Risk   9.6   7.1      3 X Average Risk  23.4   11.0                Use the calculated Patient Ratio     above and the CHD Risk Table     to determine the patient's CHD Risk.                ATP III CLASSIFICATION (LDL):      <100     mg/dL   Optimal      213-086  mg/dL   Near or Above                        Optimal      130-159  mg/dL   Borderline      578-469  mg/dL   High      >629     mg/dL   Very High  TROPONIN I     Status: Abnormal   Collection Time    01/22/13 10:50 AM      Result Value Range   Troponin I 3.45 (*) <0.30 ng/mL   Comment:            Due to the release kinetics of cTnI,     a negative result within the first hours     of the onset of symptoms does not rule out     myocardial infarction with certainty.     If myocardial infarction is still suspected,     repeat the test at appropriate intervals.     REPEATED TO VERIFY     CRITICAL VALUE NOTED.  VALUE IS CONSISTENT WITH PREVIOUSLY REPORTED AND CALLED VALUE.    Disposition:  Follow-up Information   Follow up with Thurmon Fair, MD On 02/03/2013. (3:15)    Specialty:  Cardiology   Contact information:   [address] [place] [address] [phone]       Discharge Medications:    Medication List         acetaminophen 325 MG tablet  Commonly known as:  TYLENOL  Take 2 tablets (650 mg total) by mouth every 4 (four) hours as needed.     amiodarone 200 MG tablet  Commonly known as:  PACERONE  Take 1 tablet (200 mg total) by mouth 2 (two) times daily.     aspirin 81 MG chewable tablet   Chew 162 mg by mouth daily.     benzonatate 100 MG capsule  Commonly known as:  TESSALON  Take 1 capsule (100 mg total) by mouth 3 (three) times daily as needed for cough.     donepezil 10 MG tablet  Commonly known as:  ARICEPT  Take 10 mg by mouth every morning.     furosemide 20 MG tablet  Commonly known as:  LASIX  Take 40 mg by mouth daily.     guaiFENesin 600 MG 12 hr tablet  Commonly known as:  MUCINEX  Take 1 tablet (600 mg total) by mouth 2 (two) times daily.     isosorbide mononitrate 15 mg Tb24 24 hr tablet  Commonly known as:  IMDUR  Take 1 tablet (30 mg total) by  mouth daily.     levothyroxine 75 MCG tablet  Commonly known as:  SYNTHROID, LEVOTHROID  Take 75 mcg by mouth daily.     metoprolol 50 MG tablet  Commonly known as:  LOPRESSOR  Take 1 tablet (50 mg total) by mouth 2 (two) times daily.     nitroGLYCERIN 0.4 MG SL tablet  Commonly known as:  NITROSTAT  Place 1 tablet (0.4 mg total) under the tongue every 5 (five) minutes x 3 doses as needed for chest pain.     potassium chloride 10 MEQ tablet  Commonly known as:  K-DUR,KLOR-CON  Take 1 tablet (10 mEq total) by mouth daily.         Duration of Discharge Encounter: Greater than 30 minutes including physician time.  Jolene Provost PA-C 01/23/2013 10:19 AM

## 2013-01-23 NOTE — Progress Notes (Signed)
Subjective:  A little weak this am. Chronic cough is his main complaint.  Objective:  Vital Signs in the last 24 hours: Temp:  [97.9 F (36.6 C)-98.2 F (36.8 C)] 98.2 F (36.8 C) (08/29 0506) Pulse Rate:  [60-82] 64 (08/29 0506) Resp:  [18-20] 18 (08/29 0506) BP: (100-108)/(39-60) 102/56 mmHg (08/29 0506) SpO2:  [95 %-97 %] 95 % (08/28 2140) Weight:  [135 lb 3.2 oz (61.326 kg)] 135 lb 3.2 oz (61.326 kg) (08/29 0506)  Intake/Output from previous day:  Intake/Output Summary (Last 24 hours) at 01/23/13 1610 Last data filed at 01/22/13 2140  Gross per 24 hour  Intake      6 ml  Output      0 ml  Net      6 ml    Physical Exam: General appearance: alert, cooperative and no distress Neck: no JVD Lungs: clear to auscultation bilaterally Heart: regular rate and rhythm Extremities: no edema   Rate: 66  Rhythm: paced  Lab Results:  Recent Labs  01/21/13 2210 01/22/13 0400  WBC 4.6 3.4*  HGB 11.9* 11.1*  PLT 136* 130*    Recent Labs  01/21/13 1553 01/21/13 2210 01/22/13 0400  NA 135  --  139  K 4.1  --  3.8  CL 97  --  104  CO2 30  --  25  GLUCOSE 89  --  91  BUN 19  --  17  CREATININE 0.92 0.96 0.83    Recent Labs  01/22/13 0400 01/22/13 1050  TROPONINI 3.79* 3.45*    Recent Labs  01/21/13 2210  INR 1.04    Imaging: Imaging results have been reviewed  Cardiac Studies:  Assessment/Plan:   Principal Problem:   NSTEMI (non-ST elevated myocardial infarction) Active Problems:   Chest pain at rest, with PAF with RVR   CAD, LAD stent 2003, 2006, patent with distal disease by cath 08/13/11   HTN (hypertension)   Pacemaker, St Jude 2006   Cardiomyopathy, ischemic   History of cough with hemoptysis- he is not on chronic anticoagulation   COPD (chronic obstructive pulmonary disease)   Cough   PAF (paroxysmal atrial fibrillation)    PLAN: His cough is the major complaint. He has been on Amiodarone and the dose was increase on admission for PAF.  His BNP was elevated on admission at 4k but it appears this is always elevated. He has no obvious CHF on exam but will try Lasix IV today. Dr Royann Shivers to see.   Corine Shelter PA-C Beeper [phone] 01/23/2013, 9:52 AM   I have seen and examined the patient along with Corine Shelter PA-C.  I have reviewed the chart, notes and new data.  I agree with PA's note.  Key new complaints: feels better, still coughing, producing scanty amounts of mucoid sputum; denies dyspnea and chest pain Key examination changes: clear lungs; atrial paced rhythm Key new findings / data: cTropI trending down; BNP slightly above baseline; cardiomegaly, but clear CXR.  PLAN: Amiodarone lung is considered as a possible explanation for his cough, but there are no lung infiltrates and he seems to have responded well to benzonatate. Quality of life has been the dominant concern for Johnathan [name] and his family. Length of life is less important to him. Before initiation of amiodarone, he had frequent and severely symptomatic AF, complicated by CHF and small NSTEMI (as happened on this admission). Amiodarone led to a remarkable improvement in his quality of life and prevented frequent rehospitalization.  All told, I  would continue the current antiarrhythmic strategy. DC home today.  Thurmon Fair, MD, [facility] [phone] 01/23/2013, 10:46 AM

## 2013-01-23 NOTE — Progress Notes (Signed)
Pharmacist Heart Failure Core Measure Documentation  Assessment: Johnathan [name] has an BNP of 4337 and positive troponins this admit.  He has no recent 2D-Echo, the last one on 07/2011 reveals EF of 40-45%  Rationale: Heart failure patients with left ventricular systolic dysfunction (LVSD) and an EF < 40% should be prescribed an angiotensin converting enzyme inhibitor (ACEI) or angiotensin receptor blocker (ARB) at discharge unless a contraindication is documented in the medical record.  This patient is not currently on an ACEI or ARB for HF.  This note is being placed in the record in order to provide documentation that a contraindication to the use of these agents is present for this encounter.  ACE Inhibitor or Angiotensin Receptor Blocker is contraindicated (specify all that apply)  []   ACEI allergy AND ARB allergy []   Angioedema []   Moderate or severe aortic stenosis []   Hyperkalemia [x]   Hypotension []   Renal artery stenosis []   Worsening renal function, preexisting renal disease or dysfunction  Nadara Mustard, PharmD., MS Clinical Pharmacist Pager:  [phone] Thank you for allowing pharmacy to be part of this patients care team.  01/23/2013 3:38 PM

## 2013-01-23 NOTE — ED Provider Notes (Signed)
Medical screening examination/treatment/procedure(s) were conducted as a shared visit with non-physician practitioner(s) and myself.  I personally evaluated the patient during the encounter  See attached note.  Shon Baton, MD 01/23/13 1450

## 2013-01-26 ENCOUNTER — Telehealth: Payer: Self-pay | Admitting: Cardiology

## 2013-01-26 NOTE — Telephone Encounter (Signed)
Called because of pt's dry cough which he has had for months. He is on a PPI but prefers to lay in bed as opposed to sitting up. I suggested they try him in a recliner. He has Tessalon Pearls. I explained that I could not call in anything stronger, that that would have to be a written prescription. They will try the recliner.  Corine Shelter PA-C 01/26/2013 11:43 AM

## 2013-02-01 ENCOUNTER — Encounter: Payer: Self-pay | Admitting: *Deleted

## 2013-02-03 ENCOUNTER — Encounter: Payer: Self-pay | Admitting: Cardiovascular Disease

## 2013-02-03 ENCOUNTER — Ambulatory Visit (INDEPENDENT_AMBULATORY_CARE_PROVIDER_SITE_OTHER): Payer: Medicare Other | Admitting: Cardiovascular Disease

## 2013-02-03 VITALS — BP 104/58 | HR 60 | Ht 67.0 in | Wt 143.8 lb

## 2013-02-03 DIAGNOSIS — I2510 Atherosclerotic heart disease of native coronary artery without angina pectoris: Secondary | ICD-10-CM

## 2013-02-03 DIAGNOSIS — I255 Ischemic cardiomyopathy: Secondary | ICD-10-CM

## 2013-02-03 DIAGNOSIS — I480 Paroxysmal atrial fibrillation: Secondary | ICD-10-CM

## 2013-02-03 DIAGNOSIS — R05 Cough: Secondary | ICD-10-CM

## 2013-02-03 DIAGNOSIS — Z950 Presence of cardiac pacemaker: Secondary | ICD-10-CM

## 2013-02-03 DIAGNOSIS — I4891 Unspecified atrial fibrillation: Secondary | ICD-10-CM

## 2013-02-03 DIAGNOSIS — R0602 Shortness of breath: Secondary | ICD-10-CM

## 2013-02-03 DIAGNOSIS — J449 Chronic obstructive pulmonary disease, unspecified: Secondary | ICD-10-CM

## 2013-02-03 DIAGNOSIS — I2589 Other forms of chronic ischemic heart disease: Secondary | ICD-10-CM

## 2013-02-03 MED ORDER — AMIODARONE HCL 200 MG PO TABS: ORAL_TABLET | ORAL | Status: AC

## 2013-02-03 NOTE — Patient Instructions (Addendum)
Take amiodarone 200 mg daily until the end of this month. Starting on October 1 to reduce the dose of amiodarone to 100 mg (half of a tablet) once daily.  We will make the referral for a pulmonary consultation to evaluate your chronic cough.  Your physician recommends that you schedule a follow-up appointment in: 3 months

## 2013-02-04 NOTE — Assessment & Plan Note (Signed)
He has not had any clinical or pacemaker documented return of atrial fibrillation since his recent hospital discharge.

## 2013-02-04 NOTE — Assessment & Plan Note (Signed)
Atrial fibrillation was the proximate cause of his recent non-ST segment elevation myocardial infarction, on the background of severe multivessel coronary disease that is not readily amenable to percutaneous or surgical revascularization. He neuro has had a major positive impact on his quality of life. While thin atrophic relation to have intractable chest pain and severe shortness of breath. He felt much better after this medication was initiated. I would continue it. It is of interest to note that his amiodarone had been stopped without my knowledge several months ago. I am not sure how this happened. Although breakthrough atrial fibrillation while on amiodarone therapy is quite common, the most likely event may actually have been related to a cessation in antiarrhythmic therapy. Mr. Mazo remains a very poor candidate for chronic anticoagulation. He has had another recent injury, banging his head against a nightstand. He has a fairly sizable ecchymosis in the left frontal area, but no signs of neurological problems. Will continue aspirin alone for stroke prevention.

## 2013-02-04 NOTE — Assessment & Plan Note (Addendum)
Ejection fraction 40-45% with wall motion abnormalities in the distribution of the distal LAD artery and apical scar with mild peri-infarct ischemia by nuclear stress testing. Note that he is not receiving ACE inhibitors secondary to history of volatile renal insufficiency and also secondary to his cough. His blood pressure really does not permit initiation of any additional vasodilator or beta blocker medications.

## 2013-02-04 NOTE — Assessment & Plan Note (Signed)
He was previously evaluated by Dr. Sherene Sires for this condition. The last notes describe the statistical likelihood of a variety of etiologies for chronic cough, but did not provide a specific diagnosis. Some of the features of his current cough suggest a less common etiology, bronchiectasis. He has coughed up "casts" of the thick almost solidified mucus from his airways. He does have a previous history of pneumonia. Not sure if his previous imaging studies have been sufficient to confirm or negate this diagnosis. It might be worthwhile for him to return to the pulmonary clinic for evaluation. His cough seems to clearly predates treatment with amiodarone. Amiodarone was initiated in April of 2013. Around that time he was already completing his workup with Dr. Sherene Sires.

## 2013-02-04 NOTE — Progress Notes (Signed)
Patient ID: Johnathan [name], male   DOB: Jun 06, 1915, 79 y.o.   MRN: [id number]    Reason for office visit Post hospitalization followup  [name] was recently admitted to the hospital with a small non-ST segment elevation myocardial infarction, that appeared to have been triggered by atrial fibrillation rapid ventricular response. He had previously been on treatment with a low dose of amiodarone of 100 mg daily but a few months ago this medication was discontinued. He was still on it when I last saw him in the office in November.  He was restarted on amiodarone therapy. He remains in normal rhythm today (atrial paced/ventricular paced rhythm).  He has not had any problems with chest pain. Has the same mild shortness of breath on exertion. He continues to be weak and unsteady. He banged his head against his nightstand and has a frontal ecchymosis.  His biggest complaint remains a persistent chronic mostly nonproductive cough. At times he does cough up very thick sputum that almost seems to be organized in small plugs. He was previously evaluated for a chronic cough at [facility], by Dr. Sherene Sires. At his last visit there in April of 2013 he was doing better and no followup was felt to be needed.    No Known Allergies  Current Outpatient Prescriptions  Medication Sig Dispense Refill  . acetaminophen (TYLENOL) 325 MG tablet Take 2 tablets (650 mg total) by mouth every 4 (four) hours as needed.      Marland Kitchen amiodarone (PACERONE) 200 MG tablet Take one tablet daily for the next 4 weeks, then take half tablet daily  30 tablet  5  . aspirin 81 MG chewable tablet Chew 162 mg by mouth daily.       . benzonatate (TESSALON) 100 MG capsule Take 1 capsule (100 mg total) by mouth 3 (three) times daily as needed for cough.  20 capsule  2  . donepezil (ARICEPT) 10 MG tablet Take 10 mg by mouth every morning.       Marland Kitchen ENSURE (ENSURE) Take 237 mLs by mouth daily.      . furosemide (LASIX) 20 MG tablet Take 40 mg by  mouth daily.       Marland Kitchen guaiFENesin (MUCINEX) 600 MG 12 hr tablet Take 1 tablet (600 mg total) by mouth 2 (two) times daily.  60 tablet  0  . isosorbide mononitrate (IMDUR) 15 mg TB24 24 hr tablet Take 1 tablet (30 mg total) by mouth daily.  30 tablet  5  . levothyroxine (SYNTHROID, LEVOTHROID) 75 MCG tablet Take 75 mcg by mouth daily.      . nitroGLYCERIN (NITROSTAT) 0.4 MG SL tablet Place 1 tablet (0.4 mg total) under the tongue every 5 (five) minutes x 3 doses as needed for chest pain.  25 tablet  2  . omeprazole (PRILOSEC) 20 MG capsule Take 1 capsule (20 mg total) by mouth daily.      . potassium chloride (K-DUR,KLOR-CON) 10 MEQ tablet Take 1 tablet (10 mEq total) by mouth daily.  30 tablet  5  . albuterol (PROVENTIL) (2.5 MG/3ML) 0.083% nebulizer solution 3 (three) times daily.       No current facility-administered medications for this visit.    Past Medical History  Diagnosis Date  . Myocardial infarct 2003    LAD stent  . Coronary artery disease 2006    re do LAD stent  . Bradycardia 2006    St Jude pacemaker  . BPH (benign prostatic hyperplasia)   . Pacemaker  ST JUDE VICTORY XLDR device model 62130, SERIAL [id number]  . Shortness of breath   . Arthritis   . Dementia   . Anginal pain   . Hypertension   . CHF (congestive heart failure) 11/ 2013    combined systolic and diastolic  dysfunction and mildly to mod. depressed left entricular EF at around 40%  . Thyroid disease     iatrogenic hypothyroidism  . Dysrhythmia 2013    paroxysmal atrial fib without any recent occurrences while on amiodarone therapy,on aspirin for stroke prophylaxis due preceived high risk of dangerous bleeding    Past Surgical History  Procedure Laterality Date  . Pacemaker insertion  NOV 2008    ST JUDE VICTORY  . Appendectomy    . Insert / replace / remove pacemaker    . Tee without cardioversion  08/15/2011    Procedure: TRANSESOPHAGEAL ECHOCARDIOGRAM (TEE);  Surgeon: [name], MD;   Location: [facility] ENDOSCOPY;  Service: Cardiovascular;  Laterality: N/A;  . Cardioversion  08/15/2011    Procedure: CARDIOVERSION;  Surgeon: [name], MD;  Location: [facility] ENDOSCOPY;  Service: Cardiovascular;  Laterality: N/A;  . Nm myocar perf wall motion  06/05/2011    LEXISCAN-  EF 37% ,LEFT VENT MODERATELY REDUCED;ABN PREFUSION STUDY  . Cardiac catheterization  08/13/2011    patent RCA,CIRC and patent LAD stent with distal 70% LAD and 90% 2nd diagonal ;EF 45%  . Coronary angioplasty      LAD STENTING  . Carotid doppler  10/30/2005    MILD STENOSIS RIGHT AND LEFT INTERNAL CAROTID,MODERATR STENOSIS BOTH EXTERNAL CAROTID,ANTEGRADE FLOW BOTH VERTEBRAL ARTERIES    No family history on file.  History   Social History  . Marital Status: Widowed    Spouse Name: N/A    Number of Children: N/A  . Years of Education: N/A   Occupational History  . Not on file.   Social History Main Topics  . Smoking status: Former Smoker    Quit date: 08/09/1973  . Smokeless tobacco: Never Used  . Alcohol Use: 8.4 oz/week    14 Glasses of wine per week     Comment: DAILY WINE  . Drug Use: No  . Sexual Activity: Not on file   Other Topics Concern  . Not on file   Social History Narrative  . No narrative on file    Review of systems: He continues to cough. This is somewhat better over the last 2 nights and both he and his wife finally able to get some sleep. He denies chest pain or any dyspnea at rest. The cough does not appear to be positional nor does he have orthopnea or paroxysmal nocturnal dyspnea. He denies wheezing. He has not had fever or chills or pleurisy. He denies abdominal pain, dysphagia, anorexia, nausea vomiting, change in bowel patterns. He does find it difficult to eat sometimes because of the cough. He has not had focal neurological deficits. As mentioned he did hit his head he has a night stand but does not have headaches, visual disturbance or gait problems  PHYSICAL EXAM BP  104/58  Pulse 60  Ht 5\' 7"  (1.702 m)  Wt 143 lb 12.8 oz (65.227 kg)  BMI 22.52 kg/m2  General: Alert, oriented x3, no distress, Head: Left supraorbital ecchymosis without hematoma, PERRL, EOMI, no exophtalmos or lid lag, no myxedema, no xanthelasma; normal ears, nose and oropharynx. Gaze is conjugate without nystagmus Neck: normal jugular venous pulsations and no hepatojugular reflux; brisk carotid pulses without delay and  no carotid bruits Chest: clear to auscultation, no signs of consolidation by percussion or palpation, normal fremitus, symmetrical and full respiratory excursions, sternotomy scar Cardiovascular: normal position and quality of the apical impulse, regular rhythm, normal first and paradoxically split second heart sounds, no murmurs, rubs or gallops Abdomen: no tenderness or distention, no masses by palpation, no abnormal pulsatility or arterial bruits, normal bowel sounds, no hepatosplenomegaly Extremities: no clubbing, cyanosis or edema; 2+ radial, ulnar and brachial pulses bilaterally; 2+ right femoral, posterior tibial and dorsalis pedis pulses; 2+ left femoral, posterior tibial and dorsalis pedis pulses; no subclavian or femoral bruits Neurological: grossly nonfocal   EKG: 100% atrial paced ventricular paced  Lipid Panel     Component Value Date/Time   CHOL 121 01/22/2013 0400   TRIG 59 01/22/2013 0400   HDL 51 01/22/2013 0400   CHOLHDL 2.4 01/22/2013 0400   VLDL 12 01/22/2013 0400   LDLCALC 58 01/22/2013 0400    BMET    Component Value Date/Time   NA 139 01/22/2013 0400   K 3.8 01/22/2013 0400   CL 104 01/22/2013 0400   CO2 25 01/22/2013 0400   GLUCOSE 91 01/22/2013 0400   BUN 17 01/22/2013 0400   CREATININE 0.83 01/22/2013 0400   CALCIUM 8.1* 01/22/2013 0400   GFRNONAA 73* 01/22/2013 0400   GFRAA 85* 01/22/2013 0400     ASSESSMENT AND PLAN Cough He was previously evaluated by Dr. Sherene Sires for this condition. The last notes describe the statistical likelihood of a  variety of etiologies for chronic cough, but did not provide a specific diagnosis. Some of the features of his current cough suggest a less common etiology, bronchiectasis. He has coughed up "casts" of the thick almost solidified mucus from his airways. He does have a previous history of pneumonia. Not sure if his previous imaging studies have been sufficient to confirm or negate this diagnosis. It might be worthwhile for him to return to the pulmonary clinic for evaluation. His cough seems to clearly predates treatment with amiodarone. Amiodarone was initiated in April of 2013. Around that time he was already completing his workup with Dr. Sherene Sires.  PAF (paroxysmal atrial fibrillation) Atrial fibrillation was the proximate cause of his recent non-ST segment elevation myocardial infarction, on the background of severe multivessel coronary disease that is not readily amenable to percutaneous or surgical revascularization. He neuro has had a major positive impact on his quality of life. While thin atrophic relation to have intractable chest pain and severe shortness of breath. He felt much better after this medication was initiated. I would continue it. It is of interest to note that his amiodarone had been stopped without my knowledge several months ago. I am not sure how this happened. Although breakthrough atrial fibrillation while on amiodarone therapy is quite common, the most likely event may actually have been related to a cessation in antiarrhythmic therapy. Mr. Coopman remains a very poor candidate for chronic anticoagulation. He has had another recent injury, banging his head against a nightstand. He has a fairly sizable ecchymosis in the left frontal area, but no signs of neurological problems. Will continue aspirin alone for stroke prevention.  Pacemaker, St Jude 2006 He has not had any clinical or pacemaker documented return of atrial fibrillation since his recent hospital discharge.  Cardiomyopathy,  ischemic Ejection fraction 40-45% with wall motion abnormalities in the distribution of the distal LAD artery and apical scar with mild peri-infarct ischemia by nuclear stress testing. Note that he is not receiving ACE  inhibitors secondary to history of volatile renal insufficiency and also secondary to his cough. His blood pressure really does not permit initiation of any additional vasodilator or beta blocker medications.  CAD, LAD stent 2003, 2006, patent with distal disease by cath 08/13/11    Orders Placed This Encounter  Procedures  . Ambulatory referral to Pulmonology  . EKG 12-Lead   Meds ordered this encounter  Medications  . albuterol (PROVENTIL) (2.5 MG/3ML) 0.083% nebulizer solution    Sig: 3 (three) times daily.  Marland Kitchen ENSURE (ENSURE)    Sig: Take 237 mLs by mouth daily.  Marland Kitchen amiodarone (PACERONE) 200 MG tablet    Sig: Take one tablet daily for the next 4 weeks, then take half tablet daily    Dispense:  30 tablet    Refill:  5    Order Specific Question:  Supervising Provider    Answer:  Runell Gess [[id number]]    Junious Silk, MD, [facility] [phone] office [phone] pager

## 2013-02-05 ENCOUNTER — Ambulatory Visit (INDEPENDENT_AMBULATORY_CARE_PROVIDER_SITE_OTHER)
Admission: RE | Admit: 2013-02-05 | Discharge: 2013-02-05 | Disposition: A | Discharge status: Home / Self Care | Payer: Medicare Other | Source: Ambulatory Visit | Attending: Internal Medicine | Admitting: Internal Medicine

## 2013-02-05 ENCOUNTER — Other Ambulatory Visit: Payer: Self-pay | Admitting: *Deleted

## 2013-02-05 ENCOUNTER — Other Ambulatory Visit (INDEPENDENT_AMBULATORY_CARE_PROVIDER_SITE_OTHER): Payer: Medicare Other

## 2013-02-05 ENCOUNTER — Encounter: Payer: Self-pay | Admitting: Internal Medicine

## 2013-02-05 ENCOUNTER — Telehealth: Payer: Self-pay | Admitting: Cardiovascular Disease

## 2013-02-05 ENCOUNTER — Ambulatory Visit (INDEPENDENT_AMBULATORY_CARE_PROVIDER_SITE_OTHER): Payer: Medicare Other | Admitting: Internal Medicine

## 2013-02-05 VITALS — BP 114/62 | HR 60 | Ht 67.5 in | Wt 145.4 lb

## 2013-02-05 DIAGNOSIS — J449 Chronic obstructive pulmonary disease, unspecified: Secondary | ICD-10-CM

## 2013-02-05 DIAGNOSIS — Z23 Encounter for immunization: Secondary | ICD-10-CM

## 2013-02-05 DIAGNOSIS — I4891 Unspecified atrial fibrillation: Secondary | ICD-10-CM

## 2013-02-05 DIAGNOSIS — R05 Cough: Secondary | ICD-10-CM

## 2013-02-05 DIAGNOSIS — R0602 Shortness of breath: Secondary | ICD-10-CM

## 2013-02-05 LAB — BRAIN NATRIURETIC PEPTIDE: Pro B Natriuretic peptide (BNP): 1418 pg/mL — ABNORMAL HIGH (ref 0.0–100.0)

## 2013-02-05 MED ORDER — ISOSORBIDE MONONITRATE 15 MG HALF TABLET: 30.0000 mg | ORAL_TABLET | Freq: Every day | ORAL | Status: DC

## 2013-02-05 MED ORDER — FLUTTER DEVI: Status: DC

## 2013-02-05 MED ORDER — FLUTICASONE FUROATE-VILANTEROL 100-25 MCG/INH IN AEPB: 1.0000 | INHALATION_SPRAY | Freq: Every day | RESPIRATORY_TRACT | Status: AC

## 2013-02-05 MED ORDER — METHYLPREDNISOLONE ACETATE 80 MG/ML IJ SUSP
80.0000 mg | Freq: Once | INTRAMUSCULAR | Status: AC
  Administered 2013-02-05: 80 mg via INTRAMUSCULAR

## 2013-02-05 MED ORDER — ISOSORBIDE MONONITRATE ER 30 MG PO TB24: 30.0000 mg | ORAL_TABLET | Freq: Every day | ORAL | Status: DC

## 2013-02-05 MED ORDER — LEVALBUTEROL HCL 0.63 MG/3ML IN NEBU
0.6300 mg | INHALATION_SOLUTION | Freq: Once | RESPIRATORY_TRACT | Status: AC
  Administered 2013-02-05: 0.63 mg via RESPIRATORY_TRACT

## 2013-02-05 MED ORDER — AZITHROMYCIN 250 MG PO TABS: ORAL_TABLET | ORAL | Status: DC

## 2013-02-05 NOTE — Telephone Encounter (Signed)
Katie from [facility] called.  Stated pt in office and needs clarification on dose of Imdur.  Rx for 15 mg tabs and states to take 1 tab (30 mg) daily.  Need to clarify if pt should be on 15 mg or 30 mg daily.  Unable to determine from OV.  Will notify Dr. Royann Shivers for further instructions and contact pt.  Verbalized understanding.  Page to Dr. Royann Shivers.

## 2013-02-05 NOTE — Telephone Encounter (Signed)
Message forwarded to Dr. Croitoru.  

## 2013-02-05 NOTE — Telephone Encounter (Signed)
Refill on Isosorbide MN 30mg  qd #30 w/5 refills.

## 2013-02-05 NOTE — Telephone Encounter (Signed)
Dr. Salena Saner responded and notified.  Advised pt should be taking 30 mg daily.  Refill(s) sent to pharmacy.  Call to pt.  Left message that Rx corrected and sent.  Pt to take 30 mg once daily.  Call to [facility] and informed.  Stated pt was still in the office and will inform.

## 2013-02-05 NOTE — Patient Instructions (Addendum)
Flu vax  Script sent for Zpak antibiotic  Sample Breo Ellipta       1 puff then rinse mouth, one time daily. Just use up the sample, then stop  Script for Flutter device      Blow through 4 times, repeat 4 times daily. This can help clear mucus from the airway.  Order- CXR     Dx cough             Lab- BNP   Dx Atrial fibrillation  Neb xop 0.63  Depo 80

## 2013-02-05 NOTE — Progress Notes (Signed)
02/05/13- 18 yoM former smoker Referral by Dr Croitru-COPD and cough, complicated by CAD/NSTEMI, Hx AFib/ hemoptysis 2013/ amiodarone- not on anticoagulation; dementia    PCP Dr Ricki Miller Here with ?wife and son. Chronic productive cough, much worse in the past 2 or 3 weeks since MI 01/21/2013. Nebulizer helps. Productive of white/yellow plugs with no blood. Comfortable lying down at night. Tessalon Perles do not help. Little wheeze. Does not cough with meals, no reflux recognized. Denies recently broken or missing teeth. Little shortness of breath but then says he gives out easily. Denies edema. Past history of pneumonia. History of exposure to TB from first wife- thinks he was treated. Quit smoking 1975. CXR 01/21/13 FINDINGS:  Right dual lead pacer remains in place, unchanged. Cardiomegaly.  Hyperinflation of the lungs. No confluent opacities or effusions. No  acute bony abnormality.  IMPRESSION:  Cardiomegaly, COPD. No acute findings.  Electronically Signed  By: Charlett Nose  On: 01/21/2013 16:05  Prior to Admission medications   Medication Sig Start Date End Date Taking? Authorizing Provider  acetaminophen (TYLENOL) 325 MG tablet Take 2 tablets (650 mg total) by mouth every 4 (four) hours as needed. 01/23/13  Yes Luke K Kilroy, PA-C  albuterol (PROVENTIL) (2.5 MG/3ML) 0.083% nebulizer solution 3 (three) times daily. 01/15/13  Yes Historical Provider, MD  amiodarone (PACERONE) 200 MG tablet Take one tablet daily for the next 4 weeks, then take half tablet daily 02/03/13  Yes Mihai Croitoru, MD  aspirin 81 MG chewable tablet Chew 162 mg by mouth daily.    Yes Historical Provider, MD  benzonatate (TESSALON) 100 MG capsule Take 1 capsule (100 mg total) by mouth 3 (three) times daily as needed for cough. 01/23/13  Yes Luke K Kilroy, PA-C  donepezil (ARICEPT) 10 MG tablet Take 10 mg by mouth every morning.    Yes Historical Provider, MD  ENSURE (ENSURE) Take 237 mLs by mouth daily.   Yes Historical  Provider, MD  furosemide (LASIX) 20 MG tablet Take 40 mg by mouth daily.    Yes Historical Provider, MD  guaiFENesin (MUCINEX) 600 MG 12 hr tablet Take 1 tablet (600 mg total) by mouth 2 (two) times daily. 01/23/13  Yes Luke K Kilroy, PA-C  levothyroxine (SYNTHROID, LEVOTHROID) 75 MCG tablet Take 75 mcg by mouth daily. 09/16/12  Yes Historical Provider, MD  nitroGLYCERIN (NITROSTAT) 0.4 MG SL tablet Place 1 tablet (0.4 mg total) under the tongue every 5 (five) minutes x 3 doses as needed for chest pain. 01/23/13  Yes Luke K Kilroy, PA-C  omeprazole (PRILOSEC) 20 MG capsule Take 1 capsule (20 mg total) by mouth daily. 01/23/13  Yes Marykay Lex, MD  potassium chloride (K-DUR,KLOR-CON) 10 MEQ tablet Take 1 tablet (10 mEq total) by mouth daily. 08/23/11 02/05/13 Yes Nada Boozer, NP  azithromycin (ZITHROMAX) 250 MG tablet 2 today then one daily 02/05/13   Waymon Budge, MD  Fluticasone Furoate-Vilanterol (BREO ELLIPTA) 100-25 MCG/INH AEPB Inhale 1 puff into the lungs daily. Rinse mouth after each use 02/05/13   Waymon Budge, MD  isosorbide mononitrate (IMDUR) 30 MG 24 hr tablet Take 1 tablet (30 mg total) by mouth daily. 02/05/13   Thurmon Fair, MD  Respiratory Therapy Supplies (FLUTTER) DEVI Blow through 4 times, repeat 4 times daily 02/05/13   Waymon Budge, MD   Past Medical History  Diagnosis Date  . Myocardial infarct 2003    LAD stent  . Coronary artery disease 2006    re do LAD stent  .  Bradycardia 2006    St Jude pacemaker  . BPH (benign prostatic hyperplasia)   . Pacemaker     [employer] JUDE [employer] XLDR device model 40981, SERIAL [id number]  . Shortness of breath   . Arthritis   . Dementia   . Anginal pain   . Hypertension   . CHF (congestive heart failure) 11/ 2013    combined systolic and diastolic  dysfunction and mildly to mod. depressed left entricular EF at around 40%  . Thyroid disease     iatrogenic hypothyroidism  . Dysrhythmia 2013    paroxysmal atrial fib without any  recent occurrences while on amiodarone therapy,on aspirin for stroke prophylaxis due preceived high risk of dangerous bleeding   Past Surgical History  Procedure Laterality Date  . Pacemaker insertion  NOV 2008    [employer] JUDE [employer]  . Appendectomy    . Insert / replace / remove pacemaker    . Tee without cardioversion  08/15/2011    Procedure: TRANSESOPHAGEAL ECHOCARDIOGRAM (TEE);  Surgeon: [name], MD;  Location[facility] ENDOSCOPY;  Service: Cardiovascular;  Laterality: N/A;  . Cardioversion  08/15/2011    Procedure: CARDIOVERSION;  Surgeon: [name], MD;  Location: [facility] ENDOSCOPY;  Service: Cardiovascular;  Laterality: N/A;  . Nm myocar perf wall motion  06/05/2011    LEXISCAN-  EF 37% ,LEFT VENT MODERATELY REDUCED;ABN PREFUSION STUDY  . Cardiac catheterization  08/13/2011    patent RCA,CIRC and patent LAD stent with distal 70% LAD and 90% 2nd diagonal ;EF 45%  . Coronary angioplasty      LAD STENTING  . Carotid doppler  10/30/2005    MILD STENOSIS RIGHT AND LEFT INTERNAL CAROTID,MODERATR STENOSIS BOTH EXTERNAL CAROTID,ANTEGRADE FLOW BOTH VERTEBRAL ARTERIES   History reviewed. No pertinent family history. History   Social History  . Marital Status: Widowed    Spouse Name: N/A    Number of Children: N/A  . Years of Education: N/A   Occupational History  . retired    Social History Main Topics  . Smoking status: Former Smoker    Quit date: 08/09/1973  . Smokeless tobacco: Never Used  . Alcohol Use: 8.4 oz/week    14 Glasses of wine per week     Comment: DAILY WINE  . Drug Use: No  . Sexual Activity: Not on file   Other Topics Concern  . Not on file   Social History Narrative  . No narrative on file   ROS-see HPI Constitutional:   No-   weight loss, night sweats, fevers, chills, fatigue, lassitude. HEENT:   No-  headaches, difficulty swallowing, tooth/dental problems, sore throat,       No-  sneezing, itching, ear ache, nasal congestion, post nasal drip,  CV:   No-   chest pain, orthopnea, PND, swelling in lower extremities, anasarca, dizziness, palpitations Resp: + shortness of breath with exertion or at rest.              + productive cough,  No non-productive cough,  No- coughing up of blood.              + change in color of mucus.  No- wheezing.   Skin: No-   rash or lesions. GI:  No-   heartburn, indigestion, abdominal pain, nausea, vomiting, diarrhea,                 change in bowel habits, loss of appetite GU: No-   dysuria, change in color of urine, no urgency  or frequency.  No- flank pain. MS:  No-   joint pain or swelling.  No- decreased range of motion.  No- back pain. Neuro-     nothing unusual Psych:  No- change in mood or affect. No depression or anxiety.  No memory loss.  OBJ- Physical Exam General- Alert, Oriented, Affect-appropriate, Distress- none acute Skin- rash-none, lesions- none, excoriation- none Lymphadenopathy- none Head- atraumatic            Eyes- Gross vision intact, PERRLA, conjunctivae and secretions clear            Ears- Hearing, canals-normal            Nose- Clear, no-Septal dev, mucus, polyps, erosion, perforation             Throat- Mallampati II , mucosa clear , drainage- none, tonsils- atrophic. + dental repair Neck- flexible , trachea midline, no stridor , thyroid nl, carotid no bruit Chest - symmetrical excursion , unlabored           Heart/CV- IRR , no murmur , no gallop  , no rub, nl s1 s2                           - JVD- none , edema- none, stasis changes- none, varices- none           Lung- +coarse sounds left base, unlabored, wheeze- none, cough- none , dullness-none, rub- none           Chest wall-  Abd- tender-no, distended-no, bowel sounds-present, HSM- no Br/ Gen/ Rectal- Not done, not indicated Extrem- cyanosis- none, clubbing, none, atrophy- none, strength- nl Neuro- grossly intact to observation

## 2013-02-05 NOTE — Telephone Encounter (Signed)
Katie calling about medication clarification

## 2013-02-10 NOTE — Progress Notes (Signed)
Quick Note:  Informed pt of lab results and pt verbalized understanding and has no further questions or concerns at this time ______

## 2013-02-10 NOTE — Progress Notes (Signed)
Quick Note:  Informed pt of CXR results and pt verbalized understanding and has no further questions or concerns at this time ______

## 2013-02-14 NOTE — Assessment & Plan Note (Signed)
Exacerbation since his MI 3 weeks ago suggests medication effect or interstitial edema Plan-chest x-ray, BNP, Flu vax, trial of Breo Ellipta, Z-Pak , flutter device for pulmonary toilet

## 2013-02-27 ENCOUNTER — Telehealth: Payer: Self-pay | Admitting: Cardiovascular Disease

## 2013-02-27 ENCOUNTER — Encounter: Payer: Self-pay | Admitting: Internal Medicine

## 2013-02-27 ENCOUNTER — Ambulatory Visit (INDEPENDENT_AMBULATORY_CARE_PROVIDER_SITE_OTHER): Payer: Medicare Other | Admitting: Internal Medicine

## 2013-02-27 VITALS — BP 112/60 | HR 60 | Ht 67.5 in | Wt 136.8 lb

## 2013-02-27 DIAGNOSIS — J449 Chronic obstructive pulmonary disease, unspecified: Secondary | ICD-10-CM

## 2013-02-27 DIAGNOSIS — I2589 Other forms of chronic ischemic heart disease: Secondary | ICD-10-CM

## 2013-02-27 DIAGNOSIS — I255 Ischemic cardiomyopathy: Secondary | ICD-10-CM

## 2013-02-27 DIAGNOSIS — I4891 Unspecified atrial fibrillation: Secondary | ICD-10-CM

## 2013-02-27 NOTE — Telephone Encounter (Signed)
Please call-need to know how he is suppose to take his Pacerone?

## 2013-02-27 NOTE — Telephone Encounter (Signed)
Just reduce to 200 mg once a day now, until his next appointment

## 2013-02-27 NOTE — Progress Notes (Signed)
02/05/13- 12 yoM former smoker Referral by Dr Croitru-COPD and cough, complicated by CAD/NSTEMI, Hx AFib/ hemoptysis 2013/ amiodarone- not on anticoagulation; dementia    PCP Dr Johnathan [name] Here with ?wife and son. Chronic productive cough, much worse in the past 2 or 3 weeks since MI 01/21/2013. Nebulizer helps. Productive of white/yellow plugs with no blood. Comfortable lying down at night. Tessalon Perles do not help. Little wheeze. Does not cough with meals, no reflux recognized. Denies recently broken or missing teeth. Little shortness of breath but then says he gives out easily. Denies edema. Past history of pneumonia. History of exposure to TB from first wife- thinks he was treated. Quit smoking 1975. CXR 01/21/13 FINDINGS:  Right dual lead pacer remains in place, unchanged. Cardiomegaly.  Hyperinflation of the lungs. No confluent opacities or effusions. No  acute bony abnormality.  IMPRESSION:  Cardiomegaly, COPD. No acute findings.  Electronically Signed  By: Charlett Nose  On: 01/21/2013 16:05  [birth year]- [name] Referral by Dr Croitru-COPD and cough, complicated by CAD/NSTEMI, Hx AFib/ hemoptysis 2013/ amiodarone- not on anticoagulation; dementia    PCP Dr Johnathan [name] FOLLOWS [id number] spells at times but not as bad; using Flutter has helped as well . Johnathan [name](caregiver) and Johnathan [name](son) here with patient today. Cough productive white. Flutter helps. Breo Ellipta did not help. Nebulizer helps mobilize secretions, used 2 or 3 times daily. Had flu vaccine. CXR 02/05/13 IMPRESSION:  Minimal bilateral pleural effusions. Mild central pulmonary  vascular ongestion. Mild patchy opacities of bilateral lung bases  at least in part due to atelectasis but developing pneumonias are  not excluded.  Original Report Authenticated By: Sherian Rein, M.D.  ROS-see HPI Constitutional:   No-   weight loss, night sweats, fevers, chills, fatigue, lassitude. HEENT:   No-  headaches, difficulty swallowing,  tooth/dental problems, sore throat,       No-  sneezing, itching, ear ache, nasal congestion, post nasal drip,  CV:  No-   chest pain, orthopnea, PND, swelling in lower extremities, anasarca, dizziness, palpitations Resp: + shortness of breath with exertion or at rest.              + productive cough,  No non-productive cough,  No- coughing up of blood.              No-change in color of mucus.  No- wheezing.   Skin: No-   rash or lesions. GI:  No-   heartburn, indigestion, abdominal pain, nausea, vomiting,  GU:  MS:  No-   joint pain or swelling.   Neuro-     nothing unusual Psych:  No- change in mood or affect. No depression or anxiety.  No memory loss.  OBJ- Physical Exam General- Alert, Oriented, Affect-appropriate, Distress- none acute, elderly male Skin- rash-none, lesions- none, excoriation- none Lymphadenopathy- none Head- atraumatic            Eyes- Gross vision intact, PERRLA, conjunctivae and secretions clear            Ears- Hearing, canals-normal            Nose- Clear, no-Septal dev, mucus, polyps, erosion, perforation             Throat- Mallampati II , mucosa clear , drainage- none, tonsils- atrophic. + dental repair Neck- flexible , trachea midline, no stridor , thyroid nl, carotid no bruit Chest - symmetrical excursion , unlabored           Heart/CV- IRR , no murmur , no gallop  ,  no rub, nl s1 s2                           - JVD- none , edema- none, stasis changes- none, varices- none           Lung-  unlabored, wheeze- none, cough+loose , dullness-none, rub- none           Chest wall-  Abd-  Br/ Gen/ Rectal- Not done, not indicated Extrem- cyanosis- none, clubbing, none, atrophy- none, strength- nl Neuro- grossly intact to observation

## 2013-02-27 NOTE — Telephone Encounter (Signed)
Ms Johnathan [name] states she did not follow the instruction from the office visit on 02/03/13 . The patient has been receiving Pacerone 200 mg twice a day until this past Monday when she realized the change to one tablet (200 mg) a day.  She wants to know if she should follow the instruction from 02/03/13.  Will call her back after Dr C reviews.

## 2013-02-27 NOTE — Telephone Encounter (Signed)
Returned call to emergency contact for Johnathan [name] with instructions per Dr. Royann Shivers to take pacerone 200mg  once daily until office visit in December.

## 2013-02-27 NOTE — Patient Instructions (Signed)
Continue using the Flutter device, and the nebulizer treatments as needed  You can try an otc cough syrup like Delsym or Robitussin DM, jus to soothe the throat  Please call your cardiologist for clarification about the dose of amiodarone/ Pacerone  Please call as needed

## 2013-03-11 NOTE — Assessment & Plan Note (Signed)
Latest chest x-ray suggests mild pulmonary vascular congestion. Low-grade heart failure may be contributing to cough.

## 2013-03-11 NOTE — Assessment & Plan Note (Signed)
Chest x-ray suggests mild pulmonary vascular congestion. There was question about amiodarone dose which I have asked him to direct to his cardiologist

## 2013-03-11 NOTE — Assessment & Plan Note (Signed)
Rhythm feels very regular on palpation today

## 2013-04-02 ENCOUNTER — Encounter (HOSPITAL_COMMUNITY): Payer: Self-pay | Admitting: Emergency Medicine

## 2013-04-02 ENCOUNTER — Inpatient Hospital Stay (HOSPITAL_COMMUNITY)
Admission: EM | Admit: 2013-04-02 | Discharge: 2013-04-27 | DRG: 194 | Disposition: E | Payer: Medicare Other | Attending: Internal Medicine | Admitting: Internal Medicine

## 2013-04-02 ENCOUNTER — Inpatient Hospital Stay (HOSPITAL_COMMUNITY): Payer: Medicare Other

## 2013-04-02 ENCOUNTER — Emergency Department (HOSPITAL_COMMUNITY): Payer: Medicare Other

## 2013-04-02 DIAGNOSIS — I469 Cardiac arrest, cause unspecified: Secondary | ICD-10-CM | POA: Diagnosis not present

## 2013-04-02 DIAGNOSIS — Z7982 Long term (current) use of aspirin: Secondary | ICD-10-CM

## 2013-04-02 DIAGNOSIS — Z950 Presence of cardiac pacemaker: Secondary | ICD-10-CM

## 2013-04-02 DIAGNOSIS — I5040 Unspecified combined systolic (congestive) and diastolic (congestive) heart failure: Secondary | ICD-10-CM | POA: Diagnosis present

## 2013-04-02 DIAGNOSIS — Z87891 Personal history of nicotine dependence: Secondary | ICD-10-CM

## 2013-04-02 DIAGNOSIS — I480 Paroxysmal atrial fibrillation: Secondary | ICD-10-CM

## 2013-04-02 DIAGNOSIS — R531 Weakness: Secondary | ICD-10-CM

## 2013-04-02 DIAGNOSIS — E46 Unspecified protein-calorie malnutrition: Secondary | ICD-10-CM | POA: Diagnosis present

## 2013-04-02 DIAGNOSIS — I2589 Other forms of chronic ischemic heart disease: Secondary | ICD-10-CM | POA: Diagnosis present

## 2013-04-02 DIAGNOSIS — J189 Pneumonia, unspecified organism: Principal | ICD-10-CM | POA: Diagnosis present

## 2013-04-02 DIAGNOSIS — I509 Heart failure, unspecified: Secondary | ICD-10-CM | POA: Diagnosis present

## 2013-04-02 DIAGNOSIS — I10 Essential (primary) hypertension: Secondary | ICD-10-CM | POA: Diagnosis present

## 2013-04-02 DIAGNOSIS — N400 Enlarged prostate without lower urinary tract symptoms: Secondary | ICD-10-CM | POA: Diagnosis present

## 2013-04-02 DIAGNOSIS — F0390 Unspecified dementia without behavioral disturbance: Secondary | ICD-10-CM | POA: Diagnosis present

## 2013-04-02 DIAGNOSIS — I252 Old myocardial infarction: Secondary | ICD-10-CM

## 2013-04-02 DIAGNOSIS — N179 Acute kidney failure, unspecified: Secondary | ICD-10-CM

## 2013-04-02 DIAGNOSIS — E032 Hypothyroidism due to medicaments and other exogenous substances: Secondary | ICD-10-CM | POA: Diagnosis present

## 2013-04-02 DIAGNOSIS — M129 Arthropathy, unspecified: Secondary | ICD-10-CM | POA: Diagnosis present

## 2013-04-02 DIAGNOSIS — Z79899 Other long term (current) drug therapy: Secondary | ICD-10-CM

## 2013-04-02 DIAGNOSIS — IMO0002 Reserved for concepts with insufficient information to code with codable children: Secondary | ICD-10-CM

## 2013-04-02 DIAGNOSIS — J90 Pleural effusion, not elsewhere classified: Secondary | ICD-10-CM | POA: Diagnosis present

## 2013-04-02 DIAGNOSIS — I4891 Unspecified atrial fibrillation: Secondary | ICD-10-CM | POA: Diagnosis present

## 2013-04-02 DIAGNOSIS — I2510 Atherosclerotic heart disease of native coronary artery without angina pectoris: Secondary | ICD-10-CM | POA: Diagnosis present

## 2013-04-02 DIAGNOSIS — I255 Ischemic cardiomyopathy: Secondary | ICD-10-CM | POA: Diagnosis present

## 2013-04-02 DIAGNOSIS — R05 Cough: Secondary | ICD-10-CM | POA: Diagnosis present

## 2013-04-02 LAB — URINALYSIS, ROUTINE W REFLEX MICROSCOPIC
Bilirubin Urine: NEGATIVE
Hgb urine dipstick: NEGATIVE
Leukocytes, UA: NEGATIVE
Protein, ur: NEGATIVE mg/dL
Specific Gravity, Urine: 1.016 (ref 1.005–1.030)
Urobilinogen, UA: 0.2 mg/dL (ref 0.0–1.0)
pH: 6 (ref 5.0–8.0)

## 2013-04-02 LAB — CBC WITH DIFFERENTIAL/PLATELET
Basophils Absolute: 0 10*3/uL (ref 0.0–0.1)
Basophils Relative: 0 % (ref 0–1)
Eosinophils Absolute: 0 10*3/uL (ref 0.0–0.7)
Eosinophils Relative: 0 % (ref 0–5)
Lymphocytes Relative: 7 % — ABNORMAL LOW (ref 12–46)
MCV: 101.2 fL — ABNORMAL HIGH (ref 78.0–100.0)
Neutro Abs: 7.7 10*3/uL (ref 1.7–7.7)
Platelets: 172 10*3/uL (ref 150–400)
RDW: 13.9 % (ref 11.5–15.5)
WBC: 9.3 10*3/uL (ref 4.0–10.5)

## 2013-04-02 LAB — COMPREHENSIVE METABOLIC PANEL
ALT: 102 U/L — ABNORMAL HIGH (ref 0–53)
AST: 83 U/L — ABNORMAL HIGH (ref 0–37)
Albumin: 3.9 g/dL (ref 3.5–5.2)
CO2: 26 mEq/L (ref 19–32)
Calcium: 8.7 mg/dL (ref 8.4–10.5)
Sodium: 139 mEq/L (ref 135–145)
Total Protein: 6.7 g/dL (ref 6.0–8.3)

## 2013-04-02 LAB — CBC
Hemoglobin: 13.8 g/dL (ref 13.0–17.0)
MCH: 32.1 pg (ref 26.0–34.0)
MCV: 100.2 fL — ABNORMAL HIGH (ref 78.0–100.0)
RBC: 4.3 MIL/uL (ref 4.22–5.81)
WBC: 8.2 10*3/uL (ref 4.0–10.5)

## 2013-04-02 LAB — POCT I-STAT TROPONIN I: Troponin i, poc: 0.06 ng/mL (ref 0.00–0.08)

## 2013-04-02 LAB — PRO B NATRIURETIC PEPTIDE: Pro B Natriuretic peptide (BNP): 15425 pg/mL — ABNORMAL HIGH (ref 0–450)

## 2013-04-02 LAB — CG4 I-STAT (LACTIC ACID): Lactic Acid, Venous: 4.32 mmol/L — ABNORMAL HIGH (ref 0.5–2.2)

## 2013-04-02 LAB — MAGNESIUM: Magnesium: 2.2 mg/dL (ref 1.5–2.5)

## 2013-04-02 LAB — ALBUMIN: Albumin: 4 g/dL (ref 3.5–5.2)

## 2013-04-02 LAB — CREATININE, SERUM: GFR calc Af Amer: 55 mL/min — ABNORMAL LOW (ref >90)

## 2013-04-02 LAB — EXPECTORATED SPUTUM ASSESSMENT W REFEX TO RESP CULTURE

## 2013-04-02 LAB — TROPONIN I: Troponin I: <0.3 ng/mL (ref <0.30)

## 2013-04-02 MED ORDER — NITROGLYCERIN 0.4 MG SL SUBL: 0.4000 mg | SUBLINGUAL_TABLET | SUBLINGUAL | Status: DC | PRN

## 2013-04-02 MED ORDER — POTASSIUM CHLORIDE CRYS ER 10 MEQ PO TBCR
10.0000 meq | EXTENDED_RELEASE_TABLET | Freq: Every day | ORAL | Status: DC
  Filled 2013-04-02: qty 1

## 2013-04-02 MED ORDER — DONEPEZIL HCL 10 MG PO TABS
10.0000 mg | ORAL_TABLET | Freq: Every morning | ORAL | Status: DC
  Filled 2013-04-02: qty 1

## 2013-04-02 MED ORDER — DEXTROSE 5 % IV SOLN
1.0000 g | INTRAVENOUS | Status: DC
  Administered 2013-04-02: 21:00:00 1 g via INTRAVENOUS
  Filled 2013-04-02: qty 10

## 2013-04-02 MED ORDER — AMIODARONE HCL 100 MG PO TABS
100.0000 mg | ORAL_TABLET | Freq: Every day | ORAL | Status: DC
  Filled 2013-04-02: qty 1

## 2013-04-02 MED ORDER — GUAIFENESIN ER 600 MG PO TB12
600.0000 mg | ORAL_TABLET | Freq: Two times a day (BID) | ORAL | Status: DC
  Administered 2013-04-02: 600 mg via ORAL
  Filled 2013-04-02 (×3): qty 1

## 2013-04-02 MED ORDER — ACETAMINOPHEN 325 MG PO TABS: 650.0000 mg | ORAL_TABLET | ORAL | Status: DC | PRN

## 2013-04-02 MED ORDER — LEVOTHYROXINE SODIUM 75 MCG PO TABS
75.0000 ug | ORAL_TABLET | Freq: Every day | ORAL | Status: DC
  Filled 2013-04-02 (×2): qty 1

## 2013-04-02 MED ORDER — ENSURE COMPLETE PO LIQD: 237.0000 mL | Freq: Three times a day (TID) | ORAL | Status: DC

## 2013-04-02 MED ORDER — FUROSEMIDE 20 MG PO TABS
20.0000 mg | ORAL_TABLET | Freq: Every day | ORAL | Status: DC
Start: 2013-04-03 — End: 2013-04-03
  Filled 2013-04-02: qty 1

## 2013-04-02 MED ORDER — DEXTROSE 5 % IV SOLN
500.0000 mg | INTRAVENOUS | Status: DC
  Administered 2013-04-03: 500 mg via INTRAVENOUS
  Filled 2013-04-02: qty 500

## 2013-04-02 MED ORDER — FLUTICASONE FUROATE-VILANTEROL 100-25 MCG/INH IN AEPB: 1.0000 | INHALATION_SPRAY | Freq: Every day | RESPIRATORY_TRACT | Status: DC

## 2013-04-02 MED ORDER — ENOXAPARIN SODIUM 40 MG/0.4ML ~~LOC~~ SOLN
40.0000 mg | SUBCUTANEOUS | Status: DC
  Administered 2013-04-02: 21:00:00 40 mg via SUBCUTANEOUS
  Filled 2013-04-02 (×2): qty 0.4

## 2013-04-02 MED ORDER — METHYLPREDNISOLONE SODIUM SUCC 125 MG IJ SOLR
60.0000 mg | INTRAMUSCULAR | Status: DC
  Administered 2013-04-02: 60 mg via INTRAVENOUS
  Filled 2013-04-02 (×2): qty 0.96

## 2013-04-02 MED ORDER — PROMETHAZINE-CODEINE 6.25-10 MG/5ML PO SYRP
5.0000 mL | ORAL_SOLUTION | Freq: Two times a day (BID) | ORAL | Status: DC
  Administered 2013-04-03: 5 mL via ORAL
  Filled 2013-04-02: qty 5

## 2013-04-02 MED ORDER — SODIUM CHLORIDE 0.9 % IV SOLN: INTRAVENOUS | Status: DC

## 2013-04-02 MED ORDER — SODIUM CHLORIDE 0.9 % IV SOLN
INTRAVENOUS | Status: AC
  Administered 2013-04-02: 19:00:00 via INTRAVENOUS

## 2013-04-02 MED ORDER — ASPIRIN 81 MG PO CHEW
162.0000 mg | CHEWABLE_TABLET | Freq: Every day | ORAL | Status: DC
  Filled 2013-04-02: qty 2

## 2013-04-02 MED ORDER — IPRATROPIUM BROMIDE 0.02 % IN SOLN
0.5000 mg | Freq: Four times a day (QID) | RESPIRATORY_TRACT | Status: DC
  Administered 2013-04-02: 20:00:00 0.5 mg via RESPIRATORY_TRACT
  Filled 2013-04-02: qty 2.5

## 2013-04-02 MED ORDER — FUROSEMIDE 10 MG/ML IJ SOLN
40.0000 mg | Freq: Once | INTRAMUSCULAR | Status: AC
  Administered 2013-04-02: 40 mg via INTRAVENOUS
  Filled 2013-04-02: qty 4

## 2013-04-02 NOTE — ED Notes (Signed)
Per EMS pt sent here from PCP d/t pneumonia and generalized weakness as well as abnormal labs. Pt has finished z-pack and has been on 3 days of avalox for pneumonia and is only getting worse, per his PCP. Pt lives home with his girlfriend who is not able to take care of him and per EMS family would like to have pt placed in rehab facility until he is feeling better.

## 2013-04-02 NOTE — H&P (Signed)
[facility] History and Physical  BOW BUNTYN WUJ:[id number] DOB: Mar 12, 1916 DOA: Apr 17, 2013  Referring physician:  PCP: Juline Patch, MD  Specialists:   Chief Complaint: persistent cough x 6 weeks; increased sputum production (yellow)  HPI: Johnathan [name] 79 y.o. WM PMHx  NSTEMI  01/21/2013, CHF Male Ill with persistant cough for 6 weeks, worsening with variable sputum production, now yellow. Worsening while on Avelox orally. No fever. Decreased appetite and general weakness is present. Recent MI 6 weeks ago. Last ECHO had EF 40% about 1 year ago. No ongoing Chest Pain. Taking other usual medicines without improvement. There are no other modifying factors. TODAY states (-) CP, (+) SOB, (+) Rt Lat chest wall pain, (+) productive cough (white-Red-Yellow).    Review of Systems: The patient denies anorexia, fever, vision loss, decreased hearing, hoarseness, chest pain, syncope, dyspnea on exertion, peripheral edema, balance deficits, hemoptysis, abdominal pain, melena, hematochezia, severe indigestion/heartburn, hematuria, incontinence, genital sores, muscle weakness, suspicious skin lesions, transient blindness, difficulty walking, depression, unusual weight change, abnormal bleeding, enlarged lymph nodes, angioedema, and breast masses.    TRAVEL HISTORY: None  Procedures CT Chest w/o Contrast 2013-04-17 Moderate right pleural effusion, with underlying right lung base  pneumonia. Please note, Mild motion degraded examination limits  evaluation for small parenchymal nodules. Recommend followup imaging  after treatment to verify improvement.  Cardiomegaly. Centrilobular and paraseptal emphysema.   CXR  Apr 17, 2013 1. Further volume loss on the right has occurred consistent with  development of a pleural effusion and associated atelectasis or  pneumonia. The left lung is mildly hyperinflated suggesting  underlying COPD.  2. There is persistent enlargement of the cardiac silhouette  without  evidence of pulmonary edema.  Echocardiogram 08/15/2011 (previous to most current MI)  - Left ventricle: Systolic function was mildly to moderately reduced.  -LVEF= 40% to 45%. There appears to be distal anteroseptal hypokinesis. - Aorta: Small amount of Grade 1 plaque in the proximal descending aorta. - Mitral valve: Mildly thickened leaflets . Mild central regurgitation. - Left atrium: The atrium was dilated. No evidence of thrombus in the atrial cavity or appendage. No echo smoke was noted. - Right atrium: No evidence of thrombus in the atrial cavity or appendage. - Atrial septum: No defect or patent foramen ovale was identified by color doppler. - Tricuspid valve: No evidence of vegetation. Mild regurgitation. - Pulmonic valve: No evidence of vegetation. Bowing of the IAS from left to right. - Line: A venous pacing wire was visualized in the right atrial cavity and right ventricular cavity, with its tip at the RV apex.    Past Medical History  Diagnosis Date  . Myocardial infarct 2003    LAD stent  . Coronary artery disease 2006    re do LAD stent  . Bradycardia 2006    St Jude pacemaker  . BPH (benign prostatic hyperplasia)   . Pacemaker     ST JUDE VICTORY XLDR device model 95621, SERIAL [id number]  . Shortness of breath   . Arthritis   . Dementia   . Anginal pain   . Hypertension   . CHF (congestive heart failure) 11/ 2013    combined systolic and diastolic  dysfunction and mildly to mod. depressed left entricular EF at around 40%  . Thyroid disease     iatrogenic hypothyroidism  . Dysrhythmia 2013    paroxysmal atrial fib without any recent occurrences while on amiodarone therapy,on aspirin for stroke prophylaxis due preceived high risk of dangerous bleeding   Past  Surgical History  Procedure Laterality Date  . Pacemaker insertion  NOV 2008    ST JUDE VICTORY  . Appendectomy    . Insert / replace / remove pacemaker    . Tee without cardioversion   08/15/2011    Procedure: TRANSESOPHAGEAL ECHOCARDIOGRAM (TEE);  Surgeon: [name], MD;  Location: [facility] ENDOSCOPY;  Service: Cardiovascular;  Laterality: N/A;  . Cardioversion  08/15/2011    Procedure: CARDIOVERSION;  Surgeon: [name], MD;  Location: [facility] ENDOSCOPY;  Service: Cardiovascular;  Laterality: N/A;  . Nm myocar perf wall motion  06/05/2011    LEXISCAN-  EF 37% ,LEFT VENT MODERATELY REDUCED;ABN PREFUSION STUDY  . Cardiac catheterization  08/13/2011    patent RCA,CIRC and patent LAD stent with distal 70% LAD and 90% 2nd diagonal ;EF 45%  . Coronary angioplasty      LAD STENTING  . Carotid doppler  10/30/2005    MILD STENOSIS RIGHT AND LEFT INTERNAL CAROTID,MODERATR STENOSIS BOTH EXTERNAL CAROTID,ANTEGRADE FLOW BOTH VERTEBRAL ARTERIES   Social History:  reports that he quit smoking about 39 years ago. His smoking use included Cigarettes. He has a 30 pack-year smoking history. He has never used smokeless tobacco. He reports that he drinks about 8.4 ounces of alcohol per week. He reports that he does not use illicit drugs.   No Known Allergies  History reviewed. No pertinent family history.   Prior to Admission medications   Medication Sig Start Date End Date Taking? Authorizing Provider  acetaminophen (TYLENOL) 325 MG tablet Take 2 tablets (650 mg total) by mouth every 4 (four) hours as needed. 01/23/13  Yes Luke K Kilroy, PA-C  albuterol (PROVENTIL) (2.5 MG/3ML) 0.083% nebulizer solution 3 (three) times daily. 01/15/13  Yes Historical Provider, MD  amiodarone (PACERONE) 200 MG tablet Take one tablet daily for the next 4 weeks, then take half tablet daily 02/03/13  Yes Mihai Croitoru, MD  aspirin 81 MG chewable tablet Chew 162 mg by mouth daily.    Yes Historical Provider, MD  donepezil (ARICEPT) 10 MG tablet Take 10 mg by mouth every morning.    Yes Historical Provider, MD  ENSURE (ENSURE) Take 237 mLs by mouth daily.   Yes Historical Provider, MD  Fluticasone  Furoate-Vilanterol (BREO ELLIPTA) 100-25 MCG/INH AEPB Inhale 1 puff into the lungs daily. Rinse mouth after each use 02/05/13  Yes Clinton D Young, MD  furosemide (LASIX) 20 MG tablet Take 20 mg by mouth daily.   Yes Historical Provider, MD  guaiFENesin (MUCINEX) 600 MG 12 hr tablet Take 600 mg by mouth 2 (two) times daily.   Yes Historical Provider, MD  ipratropium (ATROVENT) 0.02 % nebulizer solution Take 0.5 mg by nebulization 4 (four) times daily.   Yes Historical Provider, MD  levothyroxine (SYNTHROID, LEVOTHROID) 75 MCG tablet Take 75 mcg by mouth daily. 09/16/12  Yes Historical Provider, MD  metoprolol succinate (TOPROL-XL) 25 MG 24 hr tablet Take 25 mg by mouth daily.   Yes Historical Provider, MD  moxifloxacin (AVELOX) 400 MG tablet Take 400 mg by mouth daily at 8 pm. Started 11/4 for 7 days   Yes Historical Provider, MD  nitroGLYCERIN (NITROSTAT) 0.4 MG SL tablet Place 1 tablet (0.4 mg total) under the tongue every 5 (five) minutes x 3 doses as needed for chest pain. 01/23/13  Yes Luke K Kilroy, PA-C  potassium chloride (K-DUR,KLOR-CON) 10 MEQ tablet Take 1 tablet (10 mEq total) by mouth daily. 08/23/11 04/22/2013 Yes Nada Boozer, NP  promethazine-codeine [facility] WITH CODEINE) 6.25-10  MG/5ML syrup Take 5 mLs by mouth every 12 (twelve) hours.   Yes Historical Provider, MD  azithromycin (ZITHROMAX) 250 MG tablet Take 250 mg by mouth daily. Started 10/29    Historical Provider, MD   Physical Exam: Filed Vitals:   04/26/2013 1409 04/05/2013 1419 04/04/2013 1451 04/05/2013 1737  BP:  103/64  117/64  Pulse:  60  65  Temp:   97.4 F (36.3 C) 97.2 F (36.2 C)  TempSrc:   Oral Oral  Resp:  20  18  Weight:    60.328 kg (133 lb)  SpO2: 96% 100%  100%     General:  A./O. x4, NAD though uncomfortable secondary to refractory cough, cachectic   Eyes:  2+ equal reactive to light and accommodation   Neck: Negative JVD  Cardiovascular:  regular paced rhythm, negative murmurs rubs or gallops, DP/PT  pulse one plus bilateral   Respiratory:  bilateral expiratory wheezes, no air movement in the right lower lobe   Abdomen: Soft, nontender, nondistended plus bowel sounds   Skin:  increased turgor, tubal lacerations on patient's shin  Musculoskeletal:  +2+ pedal edema to the knees   Neurologic:  A./O. x4, pupils equal react to light and accommodation, cranial nerves II through XII intact, extremity strength 5/5, sensation intact throughout NOTE; although patient A./O. x4 the family states patient has increasing forgetfulness of a small facts  Labs on Admission:  Basic Metabolic Panel:  Recent Labs Lab 04/21/2013 1509  NA 139  K 4.1  CL 100  CO2 26  GLUCOSE 110*  BUN 48*  CREATININE 1.29  CALCIUM 8.7   Liver Function Tests:  Recent Labs Lab 04/10/2013 1509  AST 83*  ALT 102*  ALKPHOS 97  BILITOT 0.6  PROT 6.7  ALBUMIN 3.9   No results found for this basename: LIPASE, AMYLASE,  in the last 168 hours No results found for this basename: AMMONIA,  in the last 168 hours CBC:  Recent Labs Lab 04/01/2013 1509  WBC 9.3  NEUTROABS 7.7  HGB 14.2  HCT 43.6  MCV 101.2*  PLT 172   Cardiac Enzymes: No results found for this basename: CKTOTAL, CKMB, CKMBINDEX, TROPONINI,  in the last 168 hours  BNP (last 3 results)  Recent Labs  01/21/13 2210 02/05/13 1541 04/08/2013 1518  PROBNP 4337.0* 1418.0* 15686.0*   CBG: No results found for this basename: GLUCAP,  in the last 168 hours  Radiological Exams on Admission: Dg Chest 2 View  04/10/2013   CLINICAL DATA:  Dyspnea, productive cough, history of CHF.  EXAM: CHEST  2 VIEW  COMPARISON:  March 31, 2013.  FINDINGS: There is persistent volume loss on the right which has become more conspicuous and likely reflects a combination of atelectasis and a pleural effusion. There is no evidence of a pneumothorax. The left lung is well expanded. The lung markings are coarse but stable. The cardiopericardial silhouette is enlarged. The  central pulmonary vascularity is prominent, and there may be mild cephalization of the pulmonary vascular pattern. A permanent pacemaker is in place and appears unchanged. The observed portions of the bony thorax exhibit no acute abnormalities.  IMPRESSION: 1. Further volume loss on the right has occurred consistent with development of a pleural effusion and associated atelectasis or pneumonia. The left lung is mildly hyperinflated suggesting underlying COPD. 2. There is persistent enlargement of the cardiac silhouette without evidence of pulmonary edema.   Electronically Signed   By: David  [place]   On: 04/20/2013 14:54  Ct Chest Wo Contrast  April 17, 2013   CLINICAL DATA:  Fatigue, cough, evaluate for right lung pneumonia versus neoplasm versus moderate effusion.  EXAM: CT CHEST WITHOUT CONTRAST  TECHNIQUE: Multidetector CT imaging of the chest was performed following the standard protocol without IV contrast.  COMPARISON:  Chest radiograph 17-Apr-2013 and CT of the chest August 19, 2011.  FINDINGS: Corresponding to recent chest radiograph a moderate right pleural effusion, without apparent loculation is increased in size from prior CT. Resolution of left pleural effusion.  Moderately motion degraded examination limit sensitivity for small parenchymal nodules. Right lung base consolidation with air bronchograms most consistent with pneumonia.  Similar moderate centrilobular and paraseptal emphysema. Tracheobronchial tree is patent, midline. No pneumothorax. Heart is moderately enlarged, moderate coronary artery calcifications. Pericardium is nonsuspicious. Cardiac pacer with wires in place. Thoracic aorta is normal in course and caliber with moderate calcific atherosclerosis. Osteopenia without destructive bony lesions. Subcentimeter sclerotic lesion in T10 favors bone island. Trace ascites in the included view of the abdomen. Soft tissues are nonsuspicious.  IMPRESSION: Moderate right pleural effusion, with  underlying right lung base pneumonia. Please note, Mild motion degraded examination limits evaluation for small parenchymal nodules. Recommend followup imaging after treatment to verify improvement.  Cardiomegaly. Centrilobular and paraseptal emphysema.   Electronically Signed   By: Awilda Metro   On: 04-17-2013 17:28    EKG:  EKG from 9/11/2014Independently reviewed.  ventricular paced rhythm  Assessment/Plan Active Problems:   * No active hospital problems. *  Pneumonia;  -Lactic acid= 4.32; will trend -Continue Azithromycin + Ceftriaxone IV -Scheduled Breathing Tx ipratropium, Breo Ellipta -methylprednisolone 60 mg IV every 24   Cough; start patient on incentive spirometry, and Phenergan+ codeine BID  Pleural Effusion -IR to perform Therapeutic/Diagnostic Thoracentesis of Rt Pleural Effusion  A. tube #1; cell count with differential, protein, glucose, LDH, amylase  B Tube #2 Gram stain, culture, cytology  CHF -Obtain troponin x3; . Although patient is not a candidate for aggressive intervention if positive consult cardiology to ensure maximum medical management -Echocardiogram ordered -ProBNP= 15,686 -Daily a.m. weight, strict  I/O  HTN; currently patient's BP is soft hold home dose of metoprolol XL 25 mg  PAF; currently in paced rhythm continue home medications  Malnutrition -Obtain pre-albumin/albumin -Consult nutrition  -Dementia; continue home medication     Code Status:  full Family Communication:  family present for discussion of plan of care Disposition Plan:    Time spent: 90 minutes   WOODS, Roselind Messier [facility] Pager [phone]  If 7PM-7AM, please contact night-coverage [email or web address] Password [email or web address] Apr 17, 2013, 6:30 PM

## 2013-04-02 NOTE — Progress Notes (Signed)
Utilization Review completed.  Adain Geurin RN CM  

## 2013-04-02 NOTE — ED Notes (Signed)
Pt and family is aware of the need for urine sample.

## 2013-04-02 NOTE — ED Notes (Signed)
Bed: IO96 Expected date:  Expected time:  Means of arrival:  Comments: EMS- 79 yr old.  PNA and dehydration

## 2013-04-02 NOTE — ED Provider Notes (Signed)
CSN: [id number]     Arrival date & time 04/14/2013  1409 History   First MD Initiated Contact with Patient 04/02/2013 1511     Chief Complaint  Patient presents with  . Pneumonia  . abnormal labs    (Consider location/radiation/quality/duration/timing/severity/associated sxs/prior Treatment) HPI Comments: CAELLUM MANCIL is a 79 y.o. Male Ill with persistant cough for 6 weeks, worsening with variable  sputum production, now yellow. Worsening while on Avelox orally. No fever. Decreased appetite and general weakness is present. Recent MI 6 weeks ago. Last ECHO had EF 40% about 1 year ago. No ongoing Chest Pain. Taking other usual medicines without improvement. There are no other modifying factors.  Patient is a 79 y.o. male presenting with pneumonia. The history is provided by the patient and a relative.  Pneumonia    Past Medical History  Diagnosis Date  . Myocardial infarct 2003    LAD stent  . Coronary artery disease 2006    re do LAD stent  . Bradycardia 2006    St Jude pacemaker  . BPH (benign prostatic hyperplasia)   . Pacemaker     ST JUDE VICTORY XLDR device model 13086, SERIAL [id number]  . Shortness of breath   . Arthritis   . Dementia   . Anginal pain   . Hypertension   . CHF (congestive heart failure) 11/ 2013    combined systolic and diastolic  dysfunction and mildly to mod. depressed left entricular EF at around 40%  . Thyroid disease     iatrogenic hypothyroidism  . Dysrhythmia 2013    paroxysmal atrial fib without any recent occurrences while on amiodarone therapy,on aspirin for stroke prophylaxis due preceived high risk of dangerous bleeding   Past Surgical History  Procedure Laterality Date  . Pacemaker insertion  NOV 2008    ST JUDE VICTORY  . Appendectomy    . Insert / replace / remove pacemaker    . Tee without cardioversion  08/15/2011    Procedure: TRANSESOPHAGEAL ECHOCARDIOGRAM (TEE);  Surgeon: [name], MD;  Location: [facility] ENDOSCOPY;  Service:  Cardiovascular;  Laterality: N/A;  . Cardioversion  08/15/2011    Procedure: CARDIOVERSION;  Surgeon: [name], MD;  Location: [facility] ENDOSCOPY;  Service: Cardiovascular;  Laterality: N/A;  . Nm myocar perf wall motion  06/05/2011    LEXISCAN-  EF 37% ,LEFT VENT MODERATELY REDUCED;ABN PREFUSION STUDY  . Cardiac catheterization  08/13/2011    patent RCA,CIRC and patent LAD stent with distal 70% LAD and 90% 2nd diagonal ;EF 45%  . Coronary angioplasty      LAD STENTING  . Carotid doppler  10/30/2005    MILD STENOSIS RIGHT AND LEFT INTERNAL CAROTID,MODERATR STENOSIS BOTH EXTERNAL CAROTID,ANTEGRADE FLOW BOTH VERTEBRAL ARTERIES   History reviewed. No pertinent family history. History  Substance Use Topics  . Smoking status: Former Smoker -- 1.00 packs/day for 30 years    Types: Cigarettes    Quit date: 08/09/1973  . Smokeless tobacco: Never Used  . Alcohol Use: 8.4 oz/week    14 Glasses of wine per week     Comment: DAILY WINE    Review of Systems  All other systems reviewed and are negative.    Allergies  Review of patient's allergies indicates no known allergies.  Home Medications   Current Outpatient Rx  Name  Route  Sig  Dispense  Refill  . acetaminophen (TYLENOL) 325 MG tablet   Oral   Take 2 tablets (650 mg total) by mouth every  4 (four) hours as needed.         Marland Kitchen albuterol (PROVENTIL) (2.5 MG/3ML) 0.083% nebulizer solution      3 (three) times daily.         Marland Kitchen amiodarone (PACERONE) 200 MG tablet      Take one tablet daily for the next 4 weeks, then take half tablet daily   30 tablet   5   . aspirin 81 MG chewable tablet   Oral   Chew 162 mg by mouth daily.          Marland Kitchen donepezil (ARICEPT) 10 MG tablet   Oral   Take 10 mg by mouth every morning.          Marland Kitchen ENSURE (ENSURE)   Oral   Take 237 mLs by mouth daily.         . Fluticasone Furoate-Vilanterol (BREO ELLIPTA) 100-25 MCG/INH AEPB   Inhalation   Inhale 1 puff into the lungs daily. Rinse  mouth after each use   1 each   0   . furosemide (LASIX) 20 MG tablet   Oral   Take 20 mg by mouth daily.         Marland Kitchen guaiFENesin (MUCINEX) 600 MG 12 hr tablet   Oral   Take 600 mg by mouth 2 (two) times daily.         Marland Kitchen ipratropium (ATROVENT) 0.02 % nebulizer solution   Nebulization   Take 0.5 mg by nebulization 4 (four) times daily.         Marland Kitchen levothyroxine (SYNTHROID, LEVOTHROID) 75 MCG tablet   Oral   Take 75 mcg by mouth daily.         . metoprolol succinate (TOPROL-XL) 25 MG 24 hr tablet   Oral   Take 25 mg by mouth daily.         Marland Kitchen moxifloxacin (AVELOX) 400 MG tablet   Oral   Take 400 mg by mouth daily at 8 pm. Started 11/4 for 7 days         . nitroGLYCERIN (NITROSTAT) 0.4 MG SL tablet   Sublingual   Place 1 tablet (0.4 mg total) under the tongue every 5 (five) minutes x 3 doses as needed for chest pain.   25 tablet   2   . potassium chloride (K-DUR,KLOR-CON) 10 MEQ tablet   Oral   Take 1 tablet (10 mEq total) by mouth daily.   30 tablet   5   . promethazine-codeine (PHENERGAN WITH CODEINE) 6.25-10 MG/5ML syrup   Oral   Take 5 mLs by mouth every 12 (twelve) hours.         Marland Kitchen azithromycin (ZITHROMAX) 250 MG tablet   Oral   Take 250 mg by mouth daily. Started 10/29          BP 103/64  Pulse 60  Temp(Src) 97.4 F (36.3 C) (Oral)  Resp 20  SpO2 100% Physical Exam  Nursing note and vitals reviewed. Constitutional: He is oriented to person, place, and time. He appears well-developed.  Elderly, Frail  HENT:  Head: Normocephalic and atraumatic.  Right Ear: External ear normal.  Left Ear: External ear normal.  Eyes: Conjunctivae and EOM are normal. Pupils are equal, round, and reactive to light.  Neck: Normal range of motion and phonation normal. Neck supple.  Cardiovascular: Normal rate, regular rhythm, normal heart sounds and intact distal pulses.   Pulmonary/Chest: Effort normal. He has no wheezes. He has no rales. He exhibits no bony  tenderness.  Decreased BS, bilaterally, R>L. Scattered rhonchi.  Abdominal: Soft. Normal appearance. There is no tenderness.  Musculoskeletal: Normal range of motion. He exhibits no edema and no tenderness.  Neurological: He is alert and oriented to person, place, and time. No cranial nerve deficit or sensory deficit. He exhibits normal muscle tone. Coordination normal.  Skin: Skin is warm, dry and intact.  Psychiatric: He has a normal mood and affect. His behavior is normal. Judgment and thought content normal.    ED Course  Procedures (including critical care time)  Medications  furosemide (LASIX) injection 40 mg (40 mg Intravenous Given 03/31/2013 1619)    Patient Vitals for the past 24 hrs:  BP Temp Temp src Pulse Resp SpO2  April 03, 2013 1451 - 97.4 F (36.3 C) Oral - - -  04/10/2013 1419 103/64 mmHg - - 60 20 100 %  04/18/2013 1409 - - - - - 96 %       Labs Review Labs Reviewed  CBC WITH DIFFERENTIAL - Abnormal; Notable for the following:    MCV 101.2 (*)    Neutrophils Relative % 83 (*)    Lymphocytes Relative 7 (*)    All other components within normal limits  COMPREHENSIVE METABOLIC PANEL - Abnormal; Notable for the following:    Glucose, Bld 110 (*)    BUN 48 (*)    AST 83 (*)    ALT 102 (*)    GFR calc non Af Amer 45 (*)    GFR calc Af Amer 53 (*)    All other components within normal limits  PRO B NATRIURETIC PEPTIDE - Abnormal; Notable for the following:    Pro B Natriuretic peptide (BNP) 15686.0 (*)    All other components within normal limits  CG4 I-STAT (LACTIC ACID) - Abnormal; Notable for the following:    Lactic Acid, Venous 4.32 (*)    All other components within normal limits  URINALYSIS, ROUTINE W REFLEX MICROSCOPIC  POCT I-STAT TROPONIN I   Imaging Review Dg Chest 2 View  04/08/2013   CLINICAL DATA:  Dyspnea, productive cough, history of CHF.  EXAM: CHEST  2 VIEW  COMPARISON:  March 31, 2013.  FINDINGS: There is persistent volume loss on the right  which has become more conspicuous and likely reflects a combination of atelectasis and a pleural effusion. There is no evidence of a pneumothorax. The left lung is well expanded. The lung markings are coarse but stable. The cardiopericardial silhouette is enlarged. The central pulmonary vascularity is prominent, and there may be mild cephalization of the pulmonary vascular pattern. A permanent pacemaker is in place and appears unchanged. The observed portions of the bony thorax exhibit no acute abnormalities.  IMPRESSION: 1. Further volume loss on the right has occurred consistent with development of a pleural effusion and associated atelectasis or pneumonia. The left lung is mildly hyperinflated suggesting underlying COPD. 2. There is persistent enlargement of the cardiac silhouette without evidence of pulmonary edema.   Electronically Signed   By: David  [place]   On: 03/30/2013 14:54    EKG Interpretation     Ventricular Rate:  60 PR Interval:  300 QRS Duration: 186 QT Interval:  673 QTC Calculation: 673 R Axis:   -100 Text Interpretation:  Ventricular-paced rhythm No further analysis attempted due to paced rhythm since last tracing no significant change            MDM   1. Cough   2. Pleural effusion   3. Weakness  4. AKI (acute kidney injury)       Persistent cough with abnormal CXR with nonspecific findings. No frank heart failure. Lactate elevated without other findings for sepsis. He will need admission due to worsening status as OP and lack of specific treatment course identified.    Nursing Notes Reviewed/ Care Coordinated Applicable Imaging Reviewed Interpretation of Laboratory Data incorporated into ED treatment  Plan: Admit   Flint Melter, MD Apr 28, 2013 2255

## 2013-04-03 ENCOUNTER — Encounter (HOSPITAL_COMMUNITY): Payer: Self-pay | Admitting: Anesthesiology

## 2013-04-03 ENCOUNTER — Telehealth: Payer: Self-pay | Admitting: Cardiovascular Disease

## 2013-04-03 LAB — CBC WITH DIFFERENTIAL/PLATELET
Basophils Relative: 0 % (ref 0–1)
Eosinophils Absolute: 0 10*3/uL (ref 0.0–0.7)
Eosinophils Relative: 0 % (ref 0–5)
HCT: 44.1 % (ref 39.0–52.0)
Hemoglobin: 13.9 g/dL (ref 13.0–17.0)
Lymphs Abs: 0.6 10*3/uL — ABNORMAL LOW (ref 0.7–4.0)
MCH: 32.3 pg (ref 26.0–34.0)
MCHC: 31.5 g/dL (ref 30.0–36.0)
MCV: 102.3 fL — ABNORMAL HIGH (ref 78.0–100.0)
Monocytes Absolute: 0.3 10*3/uL (ref 0.1–1.0)
Monocytes Relative: 3 % (ref 3–12)
Neutro Abs: 7.5 10*3/uL (ref 1.7–7.7)
Neutrophils Relative %: 90 % — ABNORMAL HIGH (ref 43–77)
RBC: 4.31 MIL/uL (ref 4.22–5.81)

## 2013-04-03 LAB — COMPREHENSIVE METABOLIC PANEL
ALT: 105 U/L — ABNORMAL HIGH (ref 0–53)
AST: 92 U/L — ABNORMAL HIGH (ref 0–37)
Alkaline Phosphatase: 100 U/L (ref 39–117)
CO2: 21 mEq/L (ref 19–32)
Creatinine, Ser: 1.3 mg/dL (ref 0.50–1.35)
GFR calc Af Amer: 52 mL/min — ABNORMAL LOW (ref >90)
GFR calc non Af Amer: 45 mL/min — ABNORMAL LOW (ref >90)
Glucose, Bld: 209 mg/dL — ABNORMAL HIGH (ref 70–99)
Potassium: 3.7 mEq/L (ref 3.5–5.1)
Sodium: 136 mEq/L (ref 135–145)
Total Protein: 6.1 g/dL (ref 6.0–8.3)

## 2013-04-03 LAB — TROPONIN I: Troponin I: <0.3 ng/mL (ref <0.30)

## 2013-04-03 LAB — GLUCOSE, CAPILLARY: Glucose-Capillary: 186 mg/dL — ABNORMAL HIGH (ref 70–99)

## 2013-04-03 LAB — PREALBUMIN: Prealbumin: 19 mg/dL (ref 17.0–34.0)

## 2013-04-03 MED ORDER — DIPHENHYDRAMINE HCL 50 MG/ML IJ SOLN: 12.5000 mg | Freq: Once | INTRAMUSCULAR | Status: DC

## 2013-04-03 MED FILL — Medication: Qty: 1 | Status: AC

## 2013-04-07 LAB — LEGIONELLA PNEUMOPHILA TOTAL AB: Serogroups 2,3,4,5,6,8: <1:16 {titer}

## 2013-04-09 LAB — CULTURE, BLOOD (ROUTINE X 2): Culture: NO GROWTH

## 2013-04-27 NOTE — Progress Notes (Signed)
Shift event: CODE BLUE called by staff at 0147. CPR started immediately. ED MD running code. Anesthesia intubated. This NP called pt's son and POA, Peyton Najjar. Asked son's permission to stop resuscitation. He agreed while at the same time ED MD called code. Time of death 86. Son said his father "didn't want to be on life support anyway". Death certificate completed. Son on way to hospital. Jimmye Norman, NP [facility]

## 2013-04-27 NOTE — Discharge Summary (Signed)
Death Summary  Johnathan [name] AOZ:[id number] DOB: 1915/12/23 DOA: 2013/04/07  PCP: Juline Patch, MD PCP/Office notified:   Admit date: April 07, 2013 Date of Death: 04/08/2013  Final Diagnoses:  Principal Problem:   CAP (community acquired pneumonia) Active Problems:   CAD, LAD stent 2003, 2006, patent with distal disease by cath 08/13/11   HTN (hypertension)   Pacemaker, St Jude 2006   BPH (benign prostatic hyperplasia)   Cardiomyopathy, ischemic   Cough   Dementia   PAF (paroxysmal atrial fibrillation)  Procedures  CT Chest w/o Contrast 04/07/13  Moderate right pleural effusion, with underlying right lung base  pneumonia. Please note, Mild motion degraded examination limits  evaluation for small parenchymal nodules. Recommend followup imaging  after treatment to verify improvement.  Cardiomegaly. Centrilobular and paraseptal emphysema.   CXR April 07, 2013  1. Further volume loss on the right has occurred consistent with  development of a pleural effusion and associated atelectasis or  pneumonia. The left lung is mildly hyperinflated suggesting  underlying COPD.  2. There is persistent enlargement of the cardiac silhouette without  evidence of pulmonary edema.   Echocardiogram 08/15/2011 (previous to most current MI)  - Left ventricle: Systolic function was mildly to moderately reduced.  -LVEF= 40% to 45%. There appears to be distal anteroseptal hypokinesis. - Aorta: Small amount of Grade 1 plaque in the proximal descending aorta. - Mitral valve: Mildly thickened leaflets . Mild central regurgitation. - Left atrium: The atrium was dilated. No evidence of thrombus in the atrial cavity or appendage. No echo smoke was noted. - Right atrium: No evidence of thrombus in the atrial cavity or appendage. - Atrial septum: No defect or patent foramen ovale was identified by color doppler. - Tricuspid valve: No evidence of vegetation. Mild regurgitation. - Pulmonic valve: No evidence of  vegetation. Bowing of the IAS from left to right. - Line: A venous pacing wire was visualized in the right atrial cavity and right ventricular cavity, with its tip at the RV apex.     History of present illness:  Johnathan [name] 79 y.o. WM PMHx NSTEMI 01/21/2013, CHF Male Ill with persistant cough for 6 weeks, worsening with variable sputum production, now yellow. Worsening while on Avelox orally. No fever. Decreased appetite and general weakness is present. Recent MI 6 weeks ago. Last ECHO had EF 40% about 1 year ago. No ongoing Chest Pain. Taking other usual medicines without improvement. There are no other modifying factors. April 07, 2013 states (-) CP, (+) SOB, (+) Rt Lat chest wall pain, (+) productive cough (white-Red-Yellow). On 04/08/13  CODE BLUE called by staff at 0147. CPR started immediately. ED MD running code. Anesthesia intubated. This NP called pt's son and POA, Peyton Najjar. Asked son's permission to stop resuscitation. He agreed while at the same time ED MD called code. Time of death 66. Son said his father "didn't want to be on life support anyway". Death certificate completed. Son on way to hospital.  Jimmye Norman, NP Cause of Death; Cardiac Arrest   Hospital Course:  Pt admiotted on Apr 07, 2013 nd the below problems addressed;   Pneumonia;  -Lactic acid= 4.32; will trend  -Continue Azithromycin + Ceftriaxone IV  -Scheduled Breathing Tx ipratropium, Breo Ellipta  -methylprednisolone 60 mg IV every 24  Cough; start patient on incentive spirometry, and Phenergan+ codeine BID   Pleural Effusion  -IR to perform Therapeutic/Diagnostic Thoracentesis of Rt Pleural Effusion  A. tube #1; cell count with differential, protein, glucose, LDH, amylase  B Tube #2 Gram stain, culture, cytology  CHF  -Obtain troponin x3; . Although patient is not a candidate for aggressive intervention if positive consult cardiology to ensure maximum medical management  -Echocardiogram ordered  -ProBNP=  15,686  -Daily a.m. weight, strict I/O   HTN; currently patient's BP is soft hold home dose of metoprolol XL 25 mg   PAF; currently in paced rhythm continue home medications   Malnutrition  -Obtain pre-albumin/albumin  -Consult nutrition  -Dementia; continue home medication   Time: 25 min Signed:  Carolyne Littles, MD [facility] [phone]

## 2013-04-27 NOTE — Progress Notes (Signed)
Pt's body transported to the [facility] with death certificate and Release tags.  AC Jennifer assisted this RN and NT with transferring pt's body in [place].

## 2013-04-27 NOTE — Progress Notes (Signed)
Around 0147 am this RN was called to pt's room by the NT.  Lab technician had gone into pt's room to collect blood and pt was responsive but not too long afterwards pt was making a "gurgling" sound according to the Lab tech.  Lab tech notified NT and then NT came and got this RN.  Pt was not responsive and was bluish/gray in color when this RN assessed pt.  Code Blue called and CPR initiated immediately.  ED MD Palumbo-Rasch and NP Donnamarie Poag at bedside.  Pt pronounced deceased at 66am.  Pt's son, Sharbel Sahagun notified and on his way to the hospital.  Pt cleaned, IV d/c'd and pt's belongings at bedside (clothing).

## 2013-04-27 NOTE — Progress Notes (Signed)
Family at hospital. NP to bedside. Questions answered and comfort given. Offered chaplain services but family has own pastor.  Jimmye Norman, NP [facility]

## 2013-04-27 NOTE — Telephone Encounter (Signed)
Can you please remind me to sign a condolences card for his family?

## 2013-04-27 NOTE — ED Provider Notes (Signed)
[facility]  Department of Emergency Medicine   Code Blue CONSULT NOTE  Chief Complaint: Cardiac arrest/unresponsive   Level V Caveat: Unresponsive  History of present illness: I was contacted by the hospital for a CODE BLUE cardiac arrest upstairs and presented to the patient's bedside.  Patient's initial rhythm asystole.  Patient had large effusion per chest CT and pneumonia.    ROS: Unable to obtain, Level V caveat  Scheduled Meds: . sodium chloride   Intravenous STAT  . amiodarone  100 mg Oral Daily  . aspirin  162 mg Oral Daily  . azithromycin  500 mg Intravenous Q24H  . cefTRIAXone (ROCEPHIN)  IV  1 g Intravenous Q24H  . diphenhydrAMINE  12.5 mg Intravenous Once  . donepezil  10 mg Oral q morning - 10a  . enoxaparin (LOVENOX) injection  40 mg Subcutaneous Q24H  . feeding supplement (ENSURE COMPLETE)  237 mL Oral TID BM  . Fluticasone Furoate-Vilanterol  1 puff Inhalation Daily  . furosemide  20 mg Oral Daily  . guaiFENesin  600 mg Oral BID  . ipratropium  0.5 mg Nebulization QID  . levothyroxine  75 mcg Oral QAC breakfast  . methylPREDNISolone (SOLU-MEDROL) injection  60 mg Intravenous Q24H  . potassium chloride  10 mEq Oral Daily  . promethazine-codeine  5 mL Oral Q12H   Continuous Infusions: . sodium chloride 50 mL/hr at 04/22/2013 2058   PRN Meds:.acetaminophen, nitroGLYCERIN Past Medical History  Diagnosis Date  . Myocardial infarct 2003    LAD stent  . Coronary artery disease 2006    re do LAD stent  . Bradycardia 2006    St Jude pacemaker  . BPH (benign prostatic hyperplasia)   . Pacemaker     ST JUDE VICTORY XLDR device model 96045, SERIAL [id number]  . Shortness of breath   . Arthritis   . Dementia   . Anginal pain   . Hypertension   . CHF (congestive heart failure) 11/ 2013    combined systolic and diastolic  dysfunction and mildly to mod. depressed left entricular EF at around 40%  . Thyroid disease     iatrogenic hypothyroidism  .  Dysrhythmia 2013    paroxysmal atrial fib without any recent occurrences while on amiodarone therapy,on aspirin for stroke prophylaxis due preceived high risk of dangerous bleeding   Past Surgical History  Procedure Laterality Date  . Pacemaker insertion  NOV 2008    ST JUDE VICTORY  . Appendectomy    . Insert / replace / remove pacemaker    . Tee without cardioversion  08/15/2011    Procedure: TRANSESOPHAGEAL ECHOCARDIOGRAM (TEE);  Surgeon: [name], MD;  Location: [facility] ENDOSCOPY;  Service: Cardiovascular;  Laterality: N/A;  . Cardioversion  08/15/2011    Procedure: CARDIOVERSION;  Surgeon: [name], MD;  Location: [facility] ENDOSCOPY;  Service: Cardiovascular;  Laterality: N/A;  . Nm myocar perf wall motion  06/05/2011    LEXISCAN-  EF 37% ,LEFT VENT MODERATELY REDUCED;ABN PREFUSION STUDY  . Cardiac catheterization  08/13/2011    patent RCA,CIRC and patent LAD stent with distal 70% LAD and 90% 2nd diagonal ;EF 45%  . Coronary angioplasty      LAD STENTING  . Carotid doppler  10/30/2005    MILD STENOSIS RIGHT AND LEFT INTERNAL CAROTID,MODERATR STENOSIS BOTH EXTERNAL CAROTID,ANTEGRADE FLOW BOTH VERTEBRAL ARTERIES   History   Social History  . Marital Status: Widowed    Spouse Name: N/A    Number of Children: N/A  .  Years of Education: N/A   Occupational History  . retired    Social History Main Topics  . Smoking status: Former Smoker -- 1.00 packs/day for 30 years    Types: Cigarettes    Quit date: 08/09/1973  . Smokeless tobacco: Never Used  . Alcohol Use: 8.4 oz/week    14 Glasses of wine per week     Comment: DAILY WINE  . Drug Use: No  . Sexual Activity: Not on file   Other Topics Concern  . Not on file   Social History Narrative  . No narrative on file   No Known Allergies  Last set of Vital Signs (not current) Filed Vitals:   04/12/2013 2106  BP: 106/60  Pulse: 67  Temp: 97.4 F (36.3 C)  Resp: 18      Physical Exam  Gen:  unresponsive Cardiovascular: pulseless  Resp: apneic. Breath sounds equal bilaterally with bagging  Abd: nondistended  Neuro: GCS 3, unresponsive to pain  Neck: No crepitus, no carotid pulses  Musculoskeletal: No deformity  Skin: warm   Cardiopulmonary Resuscitation (CPR) Procedure Note Asystole. Continuous CPR.  4 rounds of epinephrine 1 amp of bicarb.  Without restoration of circulation.   Directed/Performed by: Jasmine Awe I personally directed ancillary staff and/or performed CPR in an effort to regain return of spontaneous circulation and to maintain cardiac, neuro and systemic perfusion.    Medical Decision making  Patient with pleural effusion and pneumonia  Assessment and Plan  Patient pronounced at 157 am.  Family notified by [facility].     Traeger Sultana Smitty Cords, MD 2013/04/11 [phone]

## 2013-04-27 NOTE — Telephone Encounter (Signed)
She wanted Dr C to know he passed away yesterday and thanks for everything.

## 2013-04-27 NOTE — Progress Notes (Signed)
Pt's family at bedside (son-Larry, daughter-in-law, grandson and girlfriend) questions answered by NP K.Kirby and information received regarding funeral home arrangements.  Pt belongings given to pt's son Peyton Najjar.

## 2013-04-27 NOTE — Telephone Encounter (Signed)
Message forwarded to Dr. Croitoru.  

## 2013-04-27 DEATH — deceased

## 2013-05-04 ENCOUNTER — Ambulatory Visit: Payer: Medicare Other | Admitting: Cardiovascular Disease

## 2013-07-26 DEATH — deceased

## 2013-09-03 ENCOUNTER — Ambulatory Visit: Payer: Medicare Other | Admitting: Internal Medicine

## 2013-11-16 IMAGING — CT CT CHEST W/O CM
1 of 2 series · 14 of 32 positions shown, 18 images · non-contrast
Comparison: Chest radiograph April 02, 2013 and CT of the chest
August 19, 2011.

CLINICAL DATA: Fatigue, cough, evaluate for right lung pneumonia
versus neoplasm versus moderate effusion.

EXAM:
CT CHEST WITHOUT CONTRAST
TECHNIQUE: Multidetector CT imaging of the chest was performed following the
standard protocol without IV contrast.

[Series 2: chest w/o st · axial · non-contrast · 0.83mm/px · z∈[-409,-134]mm · 14 of 65 slices shown, 18 images]
[im 5/65  mediastinal]
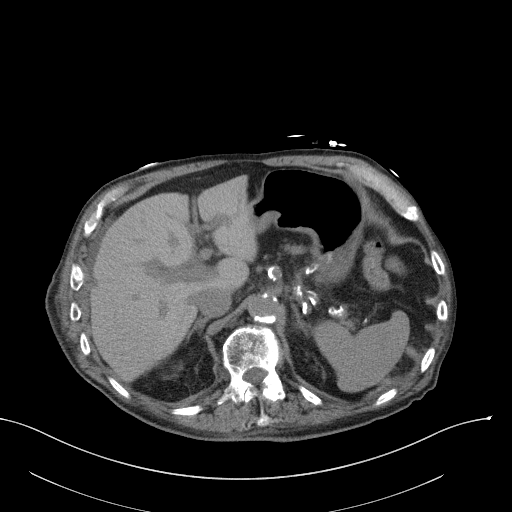
[im 5/65  lung]
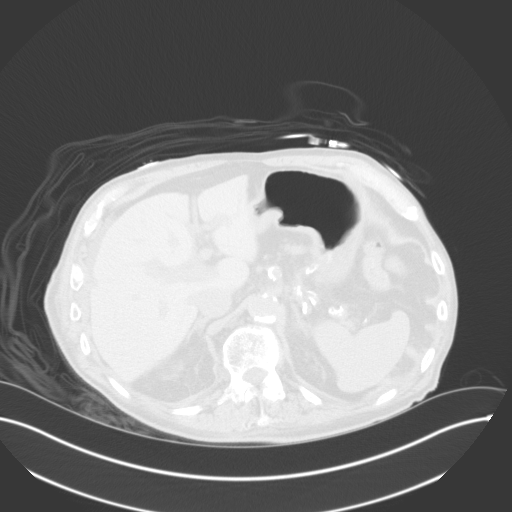
[im 10/65  lung]
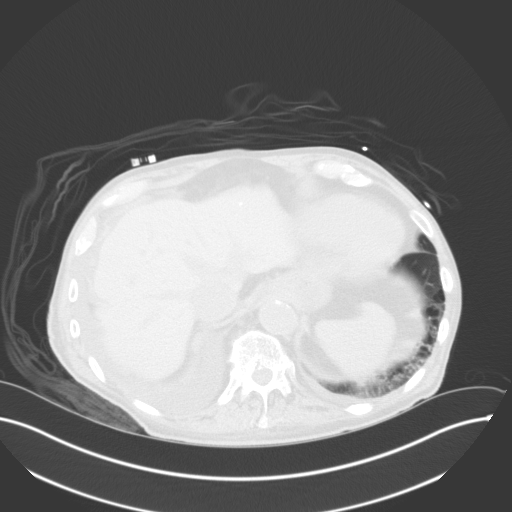
[im 15/65  lung]
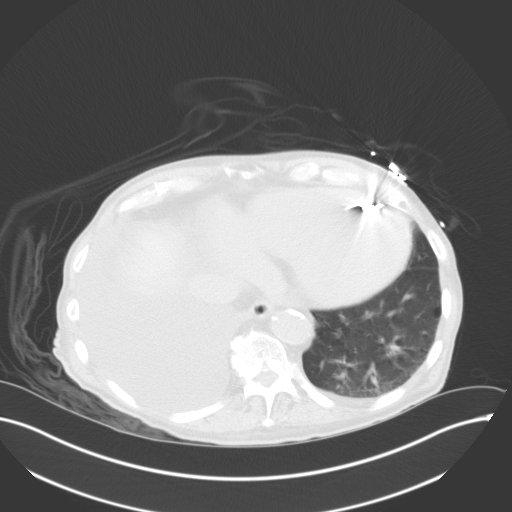
[im 20/65  lung]
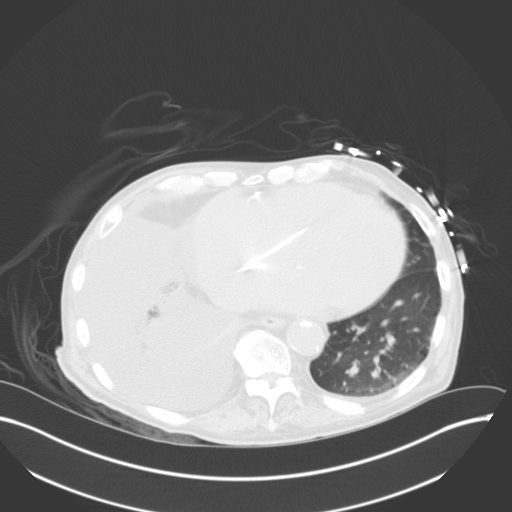
[im 25/65  mediastinal]
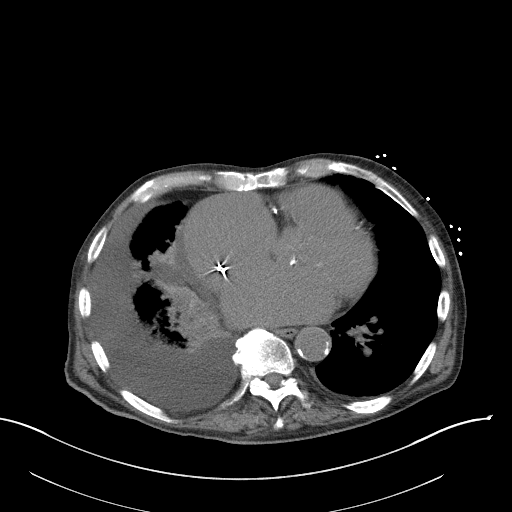
[im 25/65  lung]
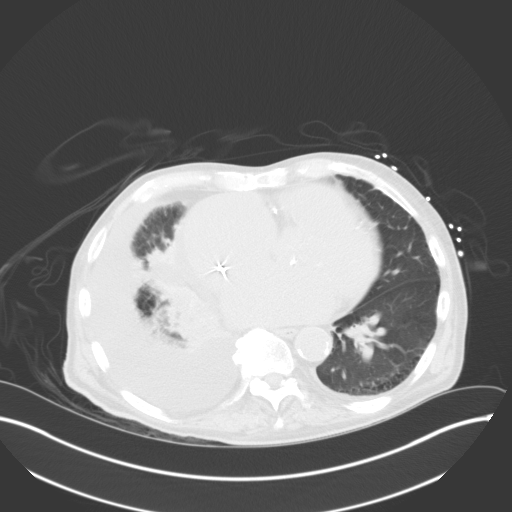
[im 30/65  lung]
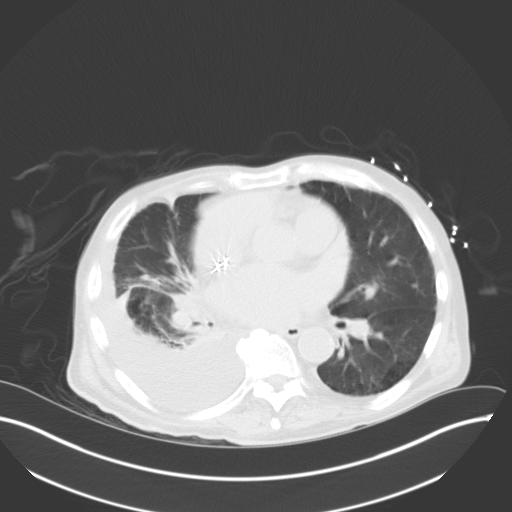
[im 31/65  lung]
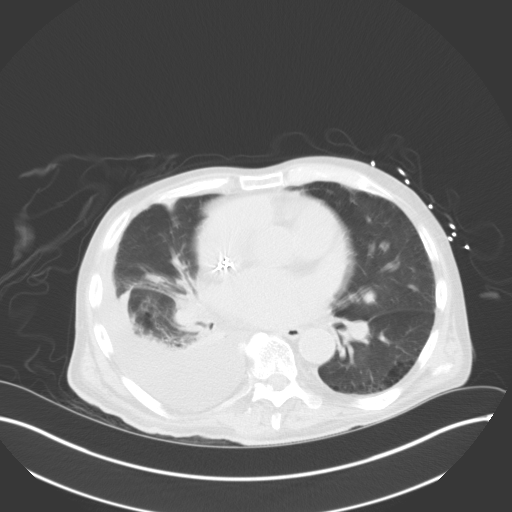
[im 33/65  lung]
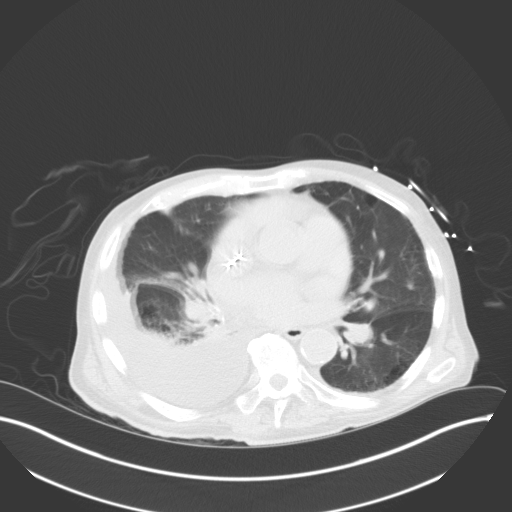
[im 35/65  mediastinal]
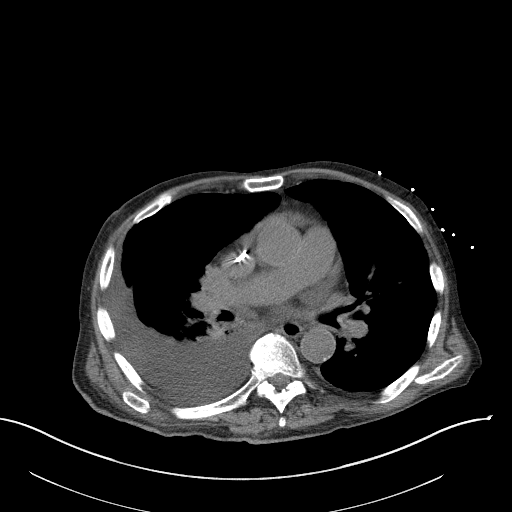
[im 35/65  lung]
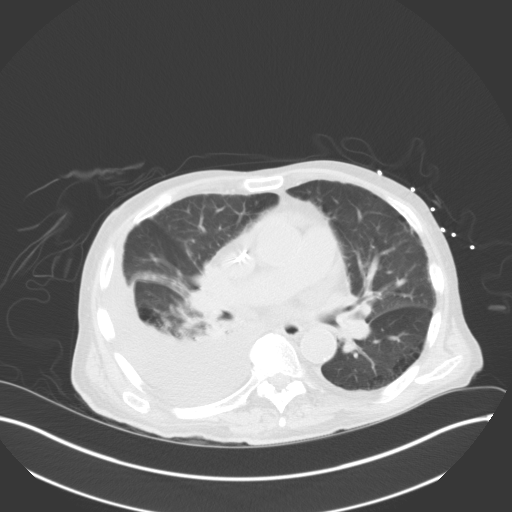
[im 40/65  lung]
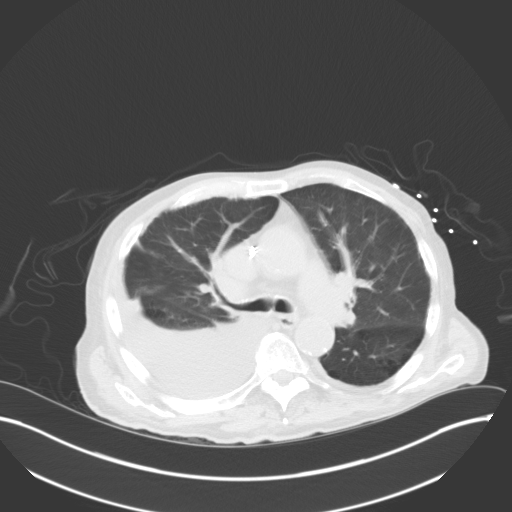
[im 45/65  lung]
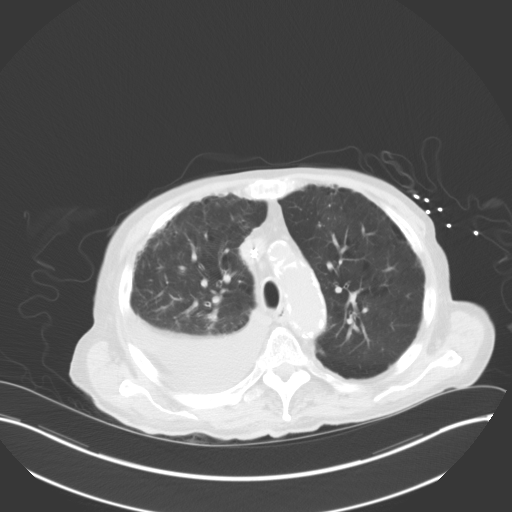
[im 50/65  lung]
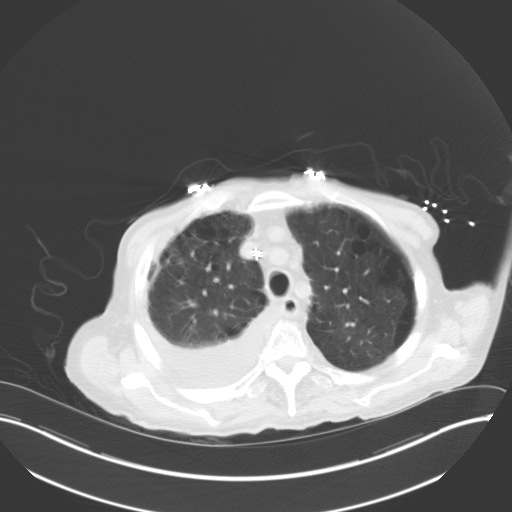
[im 55/65  mediastinal]
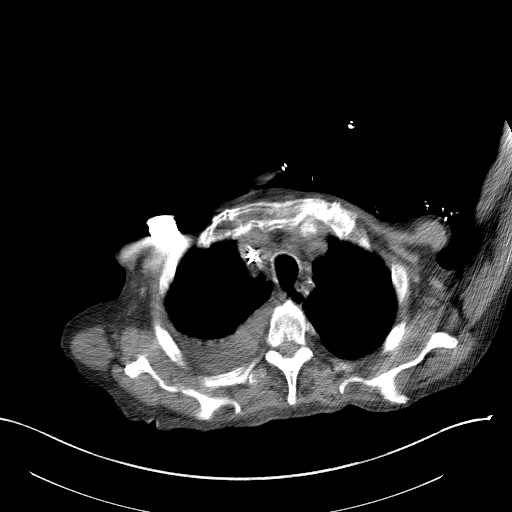
[im 55/65  lung]
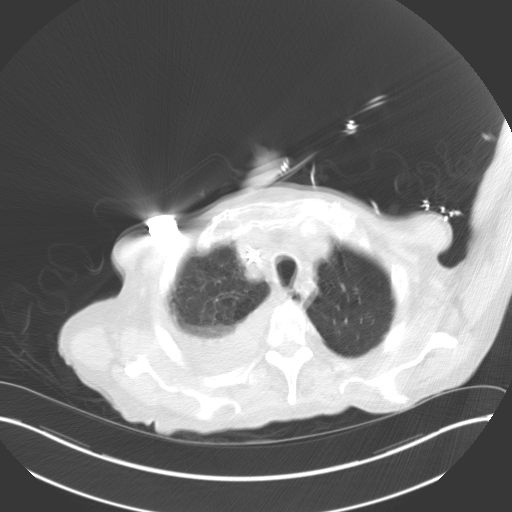
[im 60/65  lung]
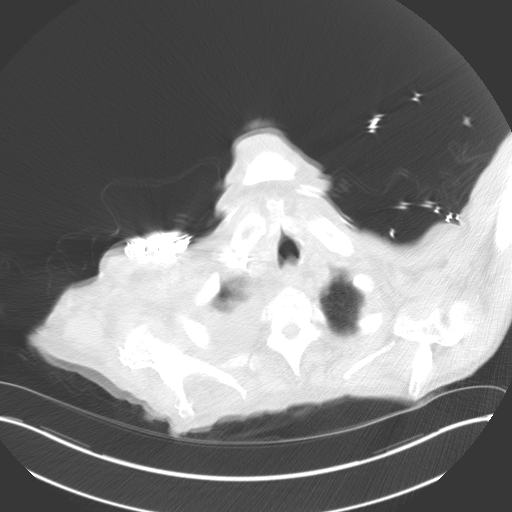

[14 of 32 positions shown; findings below may reference images not displayed]

FINDINGS: Corresponding to recent chest radiograph a moderate right pleural
effusion, without apparent loculation is increased in size from
prior CT. Resolution of left pleural effusion.

Moderately motion degraded examination limit sensitivity for small
parenchymal nodules. Right lung base consolidation with air
bronchograms most consistent with pneumonia.

Similar moderate centrilobular and paraseptal emphysema.
Tracheobronchial tree is patent, midline. No pneumothorax. Heart is
moderately enlarged, moderate coronary artery calcifications.
Pericardium is nonsuspicious. Cardiac pacer with wires in place.
Thoracic aorta is normal in course and caliber with moderate
calcific atherosclerosis. Osteopenia without destructive bony
lesions. Subcentimeter sclerotic lesion in T10 favors bone island.
Trace ascites in the included view of the abdomen. Soft tissues are
nonsuspicious.
IMPRESSION: Moderate right pleural effusion, with underlying right lung base
pneumonia. Please note, Mild motion degraded examination limits
evaluation for small parenchymal nodules. Recommend followup imaging
after treatment to verify improvement.

Cardiomegaly. Centrilobular and paraseptal emphysema.

  By: Senaida Landau

## 2014-05-06 ENCOUNTER — Encounter (HOSPITAL_COMMUNITY): Payer: Self-pay | Admitting: Cardiovascular Disease
